# Patient Record
Sex: Male | Born: 1965
Health system: Southern US, Community
[De-identification: ages and names within clinical notes are randomized; demographics above are authoritative.]

## PROBLEM LIST (undated history)

## (undated) DIAGNOSIS — J302 Other seasonal allergic rhinitis: Secondary | ICD-10-CM

## (undated) DIAGNOSIS — T7840XA Allergy, unspecified, initial encounter: Secondary | ICD-10-CM

## (undated) DIAGNOSIS — J189 Pneumonia, unspecified organism: Secondary | ICD-10-CM

## (undated) DIAGNOSIS — R112 Nausea with vomiting, unspecified: Secondary | ICD-10-CM

## (undated) DIAGNOSIS — Z9889 Other specified postprocedural states: Secondary | ICD-10-CM

## (undated) HISTORY — DX: Pneumonia, unspecified organism: J18.9

## (undated) HISTORY — DX: Allergy, unspecified, initial encounter: T78.40XA

## (undated) HISTORY — PX: HERNIA REPAIR: SHX51

---
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
## 2001-03-01 ENCOUNTER — Emergency Department (HOSPITAL_COMMUNITY): Admission: EM | Admit: 2001-03-01 | Discharge: 2001-03-01 | Payer: Self-pay | Admitting: Internal Medicine

## 2002-03-21 ENCOUNTER — Emergency Department (HOSPITAL_COMMUNITY): Admission: EM | Admit: 2002-03-21 | Discharge: 2002-03-21 | Payer: Self-pay | Admitting: Emergency Medicine

## 2002-03-21 ENCOUNTER — Encounter: Payer: Self-pay | Admitting: Emergency Medicine

## 2007-12-09 HISTORY — PX: OTHER SURGICAL HISTORY: SHX169

## 2008-11-28 ENCOUNTER — Ambulatory Visit (HOSPITAL_COMMUNITY): Admission: RE | Admit: 2008-11-28 | Discharge: 2008-11-28 | Payer: Self-pay | Admitting: Orthopedic Surgery

## 2008-12-08 DIAGNOSIS — J189 Pneumonia, unspecified organism: Secondary | ICD-10-CM

## 2008-12-08 HISTORY — DX: Pneumonia, unspecified organism: J18.9

## 2011-04-22 NOTE — Op Note (Signed)
[name], [name]                ACCOUNT NO.:  [id number]   MEDICAL RECORD NO.:  [id number]          PATIENT TYPE:  AMB   LOCATION:  SDS                          FACILITY:  [facility]   PHYSICIAN:  Burnard Bunting, M.D.    DATE OF BIRTH:  Feb 21, 1966   DATE OF PROCEDURE:  11/28/2008  DATE OF DISCHARGE:  11/28/2008                               OPERATIVE REPORT   PREOPERATIVE DIAGNOSIS:  Left ulnar nerve instability.   POSTOPERATIVE DIAGNOSIS:  Left ulnar nerve instability.   PROCEDURE:  Left ulnar nerve anterior transposition.   SURGEON:  Burnard Bunting, MD   ASSISTANT:  Wende Neighbors, PA   ANESTHESIA:  General endotracheal.   ESTIMATED BLOOD LOSS:  25 mL.   TOURNIQUET TIME:  Approximately an hour at 300 mmHg.   INDICATIONS:  Cory Shaw is a 45 year old patient with ulnar nerve  instability who presents now for operative management after explanation  of risks and benefits.   PROCEDURE IN DETAIL:  The patient was brought to the operating room  where general endotracheal anesthesia was induced.  A time-out was  called.  Left arm was prepped with DuraPrep and draped in the sterile  manner.  [name] was used to cover the operative field.  A sterile  tourniquet was utilized.  He arm was elevated and exsanguinated with  Esmarch wrap.  Tourniquet was inflated.  Incision over the medial  condyle was made and extended 4 cm proximal and distal.  Skin and  subcutaneous tissue were sharply divided.  Crossing branches of the  medial antebrachial cutaneous nerve were preserved.  Ulnar nerve was  identified and was unstable with flexion.  The cubital tunnel was then  carefully exposed with great care taken to preserve all of blood vessels  to the artery itself.  Intermuscular septum was then excised, but a  smooth attachment was maintained at the proximal aspect of the medial  epicondyle.  This was used as a sling for the ulnar nerve transposition.  The forearm fascia was then split distally  and dissection was carried up  to the first motor branch of the ulnar nerve.  The ulnar nerve was  freely mobile over the within the sling.  At this time, the sling was  then tensioned to the overlying fascia of the subdermal layer of the  skin.  There was free mobility of the nerve within the sling, which had  a length of about a centimeter.  Arm was taken through a range of motion  and there was no return of the ulnar nerve into the groove.  At this  time, tourniquet was released.  Bleeding points were encountered which  were controlled using electrocautery.  Sling was secured using 2-0  Vicryl suture.  Skin was then closed after a thorough irrigation using  interrupted inverted 2-0 Vicryl suture and running 3-0 pullout Prolene.  Well-padded bulky posterior splint was applied along with a sling.  The  patient  was transferred to the recovery room in stable condition.  Velna Hatchet  Vernon's assistance was required during the case for mobilization and  limb positioning as well as retraction of important neurovascular  structures.  Her assistance was a medical necessity.      Burnard Bunting, M.D.  Electronically Signed     GSD/MEDQ  D:  11/28/2008  T:  11/29/2008  Job:  [id number]

## 2011-07-15 ENCOUNTER — Other Ambulatory Visit (HOSPITAL_COMMUNITY): Payer: Self-pay | Admitting: Orthopedic Surgery

## 2011-07-15 DIAGNOSIS — R531 Weakness: Secondary | ICD-10-CM

## 2011-07-15 DIAGNOSIS — M25529 Pain in unspecified elbow: Secondary | ICD-10-CM

## 2011-07-17 ENCOUNTER — Ambulatory Visit (HOSPITAL_COMMUNITY)
Admission: RE | Admit: 2011-07-17 | Discharge: 2011-07-17 | Disposition: A | Discharge status: Home / Self Care | Payer: 59 | Source: Ambulatory Visit | Attending: Orthopedic Surgery | Admitting: Orthopedic Surgery

## 2011-07-17 DIAGNOSIS — M25529 Pain in unspecified elbow: Secondary | ICD-10-CM | POA: Insufficient documentation

## 2011-07-17 DIAGNOSIS — G5620 Lesion of ulnar nerve, unspecified upper limb: Secondary | ICD-10-CM | POA: Insufficient documentation

## 2011-07-17 DIAGNOSIS — R531 Weakness: Secondary | ICD-10-CM

## 2011-09-12 LAB — CBC
HCT: 42.7 % (ref 39.0–52.0)
Hemoglobin: 14.4 g/dL (ref 13.0–17.0)
MCHC: 33.7 g/dL (ref 30.0–36.0)
MCV: 90.2 fL (ref 78.0–100.0)
Platelets: 150 10*3/uL (ref 150–400)
RBC: 4.73 MIL/uL (ref 4.22–5.81)
RDW: 12.9 % (ref 11.5–15.5)
WBC: 5.5 10*3/uL (ref 4.0–10.5)

## 2012-03-17 ENCOUNTER — Emergency Department (INDEPENDENT_AMBULATORY_CARE_PROVIDER_SITE_OTHER): Payer: 59

## 2012-03-17 ENCOUNTER — Encounter (HOSPITAL_COMMUNITY): Payer: Self-pay | Admitting: *Deleted

## 2012-03-17 ENCOUNTER — Emergency Department (HOSPITAL_COMMUNITY)
Admission: EM | Admit: 2012-03-17 | Discharge: 2012-03-17 | Disposition: A | Discharge status: Home / Self Care | Payer: 59 | Source: Home / Self Care | Attending: Emergency Medicine | Admitting: Emergency Medicine

## 2012-03-17 DIAGNOSIS — J189 Pneumonia, unspecified organism: Secondary | ICD-10-CM

## 2012-03-17 HISTORY — DX: Other seasonal allergic rhinitis: J30.2

## 2012-03-17 MED ORDER — CLARITHROMYCIN 500 MG PO TABS: 500.0000 mg | ORAL_TABLET | Freq: Two times a day (BID) | ORAL | Status: AC

## 2012-03-17 MED ORDER — LIDOCAINE HCL (PF) 1 % IJ SOLN
INTRAMUSCULAR | Status: AC
  Filled 2012-03-17: qty 5

## 2012-03-17 MED ORDER — CEFTRIAXONE SODIUM 1 G IJ SOLR
INTRAMUSCULAR | Status: AC
  Filled 2012-03-17: qty 10

## 2012-03-17 MED ORDER — CEFTRIAXONE SODIUM 1 G IJ SOLR
1.0000 g | Freq: Once | INTRAMUSCULAR | Status: AC
  Administered 2012-03-17: 1 g via INTRAMUSCULAR

## 2012-03-17 NOTE — Discharge Instructions (Signed)

## 2012-03-17 NOTE — ED Notes (Signed)
Pt states that 3 weeks ago he had an URI that he took care of himself with otc meds and rest.  States he felt better in a week, but noticed that he would have sob with exertion.  States he normally runs 3-6 miles a few times a week, but has not been able to do that and going up a flight of stairs causes him to feel tight in his chest and sob.  Denies chest pain, nausea.  Color good.

## 2012-03-17 NOTE — ED Provider Notes (Signed)
Chief Complaint  Patient presents with  . Shortness of Breath    History of Present Illness:   The patient is a 46 year old male who had an upper respiratory infection about 2 weeks ago with nasal congestion, rhinorrhea, sore throat, and cough. His symptoms all got better, but since then he's been short of breath with minimal exertion including one flight dyspnea, he usually runs daily and cannot do this. He does get some cough with exertion or exercise. He has some pain when he takes a deep breath or with exercise in his chest. He feels tired and run down. No fever, chills, nausea, vomiting, wheezing, or history of asthma. He has had no prior history of pneumonia.  Review of Systems:  Other than noted above, the patient denies any of the following symptoms. Systemic:  No fever, chills, sweats, or fatigue. ENT:  No nasal congestion, rhinorrhea, or sore throat. Pulmonary:  No cough, wheezing, shortness of breath, sputum production, hemoptysis. Cardiac:  No palpitations, rapid heartbeat, dizziness, presyncope or syncope. GI:  No abdominal pain, heartburn, nausea, or vomiting. Skin:  No rash or itching. Ext:  No leg pain or swelling.   PMFSH:  Past medical history, family history, social history, meds, and allergies were reviewed and updated as needed.  Physical Exam:   Vital signs:  BP 117/66  Pulse 75  Temp(Src) 99.4 F (37.4 C) (Oral)  Resp 18  SpO2 99% Gen:  Alert, oriented, in no distress, skin warm and dry. Eye:  PERRL, lids and conjunctivas normal.  Sclera non-icteric. ENT:  Mucous membranes moist, pharynx clear. Neck:  Supple, no adenopathy or tenderness.  No JVD. Lungs:  Clear to auscultation and percussion, no wheezes, rales or rhonchi. No egophony. No respiratory distress. Heart:  Regular rhythm.  No gallops, murmers, clicks or rubs. Chest:  No chest wall tenderness. Abdomen:  Soft, nontender, no organomegaly or mass.  Bowel sounds normal.  No pulsatile abdominal mass or  bruit. Ext:  No edema.  No calf tenderness and Homann's sign negative.  Pulses full and equal. Skin:  Warm and dry.  No rash.   Radiology:  Dg Chest 2 View  03/17/2012  *RADIOLOGY REPORT*  Clinical Data: Short of breath  CHEST - 2 VIEW  Comparison: 11/24/2008  Findings: Heart size is normal.  Mediastinal shadows are normal. There is patchy infiltrate in both lower lobes consistent with mild pneumonia.  No dense consolidation or major collapse.  No effusions.  No significant bony finding.  IMPRESSION: Patchy infiltrate in both lower lobes consistent with pneumonia.  Original Report Authenticated By: Thomasenia Sales, M.D.    EKG:   Date: 03/17/2012  Rate:   Rhythm: normal sinus rhythm  QRS Axis: normal  Intervals: normal  ST/T Wave abnormalities: normal  Conduction Disutrbances:none  Narrative Interpretation: Normal EKG.  Old EKG Reviewed: none available  An i-STAT 8 was also obtained which was completely normal, however results did not crossover into the note.   Medications given in U[facility]:  He was given Rocephin 1 g IM and tolerated this well without any immediate side effects.  Assessment:  The encounter diagnosis was Community acquired pneumonia.   Plan:   1.  The following meds were prescribed:   New Prescriptions   CLARITHROMYCIN (BIAXIN) 500 MG TABLET    Take 1 tablet (500 mg total) by mouth 2 (two) times daily.   2.  The patient was instructed in symptomatic care and handouts were given. 3.  The patient was told to  return if becoming worse in any way, if no better in 3 or 4 days, and given some red flag symptoms that would indicate earlier return.  Follow up:  The patient was told to follow up with his primary care physician, Dr. Sigmund Hazel next week, he was also told to get a repeat chest x-ray a month.     Reuben Likes, MD 03/17/12 [phone]

## 2012-03-18 LAB — POCT I-STAT, CHEM 8
BUN: 22 mg/dL (ref 6–23)
Calcium, Ion: 1.24 mmol/L (ref 1.12–1.32)
Chloride: 103 mEq/L (ref 96–112)
Creatinine, Ser: 1.1 mg/dL (ref 0.50–1.35)
Glucose, Bld: 105 mg/dL — ABNORMAL HIGH (ref 70–99)
HCT: 43 % (ref 39.0–52.0)
Hemoglobin: 14.6 g/dL (ref 13.0–17.0)
Potassium: 4.3 mEq/L (ref 3.5–5.1)
Sodium: 141 mEq/L (ref 135–145)
TCO2: 28 mmol/L (ref 0–100)

## 2012-06-22 ENCOUNTER — Telehealth: Payer: Self-pay | Admitting: Internal Medicine

## 2012-06-22 ENCOUNTER — Ambulatory Visit (INDEPENDENT_AMBULATORY_CARE_PROVIDER_SITE_OTHER): Payer: 59 | Admitting: Internal Medicine

## 2012-06-22 ENCOUNTER — Other Ambulatory Visit (INDEPENDENT_AMBULATORY_CARE_PROVIDER_SITE_OTHER): Payer: 59

## 2012-06-22 ENCOUNTER — Encounter: Payer: Self-pay | Admitting: Internal Medicine

## 2012-06-22 ENCOUNTER — Ambulatory Visit (INDEPENDENT_AMBULATORY_CARE_PROVIDER_SITE_OTHER)
Admission: RE | Admit: 2012-06-22 | Discharge: 2012-06-22 | Disposition: A | Discharge status: Home / Self Care | Payer: 59 | Source: Ambulatory Visit | Attending: Internal Medicine | Admitting: Internal Medicine

## 2012-06-22 VITALS — BP 112/78 | HR 70 | Temp 98.2°F | Ht 69.0 in | Wt 218.4 lb

## 2012-06-22 DIAGNOSIS — R918 Other nonspecific abnormal finding of lung field: Secondary | ICD-10-CM

## 2012-06-22 LAB — CBC WITH DIFFERENTIAL/PLATELET
Basophils Absolute: 0 10*3/uL (ref 0.0–0.1)
Basophils Relative: 0 % (ref 0.0–3.0)
Eosinophils Absolute: 0.2 10*3/uL (ref 0.0–0.7)
Eosinophils Relative: 3.5 % (ref 0.0–5.0)
HCT: 43.9 % (ref 39.0–52.0)
Hemoglobin: 14.8 g/dL (ref 13.0–17.0)
Lymphocytes Relative: 33.4 % (ref 12.0–46.0)
Lymphs Abs: 2.3 10*3/uL (ref 0.7–4.0)
MCHC: 33.8 g/dL (ref 30.0–36.0)
MCV: 88.2 fl (ref 78.0–100.0)
Monocytes Absolute: 0.5 10*3/uL (ref 0.1–1.0)
Monocytes Relative: 7.9 % (ref 3.0–12.0)
Neutro Abs: 3.7 10*3/uL (ref 1.4–7.7)
Neutrophils Relative %: 55.2 % (ref 43.0–77.0)
Platelets: 166 10*3/uL (ref 150.0–400.0)
RBC: 4.97 Mil/uL (ref 4.22–5.81)
RDW: 13 % (ref 11.5–14.6)
WBC: 6.8 10*3/uL (ref 4.5–10.5)

## 2012-06-22 LAB — SEDIMENTATION RATE: Sed Rate: 24 mm/hr — ABNORMAL HIGH (ref 0–22)

## 2012-06-22 MED ORDER — FAMOTIDINE 20 MG PO TABS: ORAL_TABLET | ORAL | Status: DC

## 2012-06-22 MED ORDER — PANTOPRAZOLE SODIUM 40 MG PO TBEC: 40.0000 mg | DELAYED_RELEASE_TABLET | Freq: Every day | ORAL | Status: DC

## 2012-06-22 NOTE — Progress Notes (Signed)
  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   06/22/2012 1st pulmonary eval cc abupt onset of sob and dry cough 03/22/12 dx as pna rx as outpt including abx and prednisone and inhalers > overall 50% better but can't jog and can't walk out in humidity without provoking coughing. Albuterol helps transiently.  No purulent sputum, arthralgias. No unusual exp hx. No better p prednisone.   Sleeping ok without nocturnal  or early am exacerbation  of respiratory  c/o's or need for noct saba. Also denies any obvious fluctuation of symptoms with weather or environmental changes or other aggravating or alleviating factors except as outlined above     Review of Systems  Constitutional: Negative for fever, chills, activity change, appetite change and unexpected weight change.  HENT: Positive for congestion. Negative for sore throat, rhinorrhea, sneezing, trouble swallowing, dental problem, voice change and postnasal drip.   Eyes: Negative for visual disturbance.  Respiratory: Positive for cough and shortness of breath. Negative for choking.   Cardiovascular: Negative for chest pain and leg swelling.  Gastrointestinal: Negative for nausea, vomiting and abdominal pain.  Genitourinary: Negative for difficulty urinating.  Musculoskeletal: Negative for arthralgias.  Skin: Negative for rash.  Psychiatric/Behavioral: Negative for behavioral problems and confusion.       Objective:   Physical Exam  amb wm nad Wt 218 06/22/2012  HEENT: nl dentition, turbinates, and orophanx. Nl external ear canals without cough reflex   NECK :  without JVD/Nodes/TM/ nl carotid upstrokes bilaterally   LUNGS: no  acc muscle use, trace insp crackles best heard R Base bilaterally with  cough on insp    CV:  RRR  no s3 or murmur or increase in P2, no edema   ABD:  soft and nontender with nl excursion in the supine position. No bruits or organomegaly, bowel sounds nl  MS:  warm without deformities, calf tenderness, cyanosis or clubbing  SKIN: warm and dry without lesions    NEURO:  alert, approp, no deficits    CXR  06/22/2012 : Patchy right middle and bilateral lower lobe opacities, suspicious for pneumonia.  ESR 24 06/22/12 with no eos         Assessment & Plan:

## 2012-06-22 NOTE — Telephone Encounter (Signed)
Famotidine rx has been re sent.  Cory Shaw with [facility] aware.

## 2012-06-22 NOTE — Patient Instructions (Addendum)
Please remember to go to the lab and x-ray department downstairs for your tests - we will call you with the results when they are available.    Try protonix  Take 30-60 min before first meal of the day and Pepcid 20 mg one bedtime until return   GERD (REFLUX)  is an extremely common cause of respiratory symptoms, many times with no significant heartburn at all.    It can be treated with medication, but also with lifestyle changes including avoidance of late meals, excessive alcohol, smoking cessation, and avoid fatty foods, chocolate, peppermint, colas, red wine, and acidic juices such as orange juice.  NO MINT OR MENTHOL PRODUCTS SO NO COUGH DROPS  USE SUGARLESS CANDY INSTEAD (jolley ranchers or Stover's)  NO OIL BASED VITAMINS - use powdered substitutes.  Please schedule a follow up office visit in 4 weeks, sooner if needed

## 2012-06-23 ENCOUNTER — Other Ambulatory Visit: Payer: Self-pay | Admitting: Internal Medicine

## 2012-06-23 DIAGNOSIS — R918 Other nonspecific abnormal finding of lung field: Secondary | ICD-10-CM

## 2012-06-23 LAB — ALLERGY PROFILE REGION II-DC, DE, MD, ~~LOC~~, VA
Allergen, D pternoyssinus,d7: 0.19 kU/L (ref <0.35)
Alternaria Alternata: <0.1 kU/L (ref <0.35)
Aspergillus fumigatus, IgG: <0.1 kU/L (ref <0.35)
Bermuda Grass: <0.1 kU/L (ref <0.35)
Box Elder IgE: <0.1 kU/L (ref <0.35)
Cat Dander: <0.1 kU/L (ref <0.35)
Cladosporium Herbarum: <0.1 kU/L (ref <0.35)
Cockroach: <0.1 kU/L (ref <0.35)
Common Ragweed: <0.1 kU/L (ref <0.35)
D. farinae: 0.27 kU/L (ref <0.35)
Dog Dander: <0.1 kU/L (ref <0.35)
Elm IgE: <0.1 kU/L (ref <0.35)
IgE (Immunoglobulin E), Serum: 15.9 IU/mL (ref 0.0–180.0)
Johnson Grass: <0.1 kU/L (ref <0.35)
Lamb's Quarters: <0.1 kU/L (ref <0.35)
Meadow Grass: <0.1 kU/L (ref <0.35)
Oak: <0.1 kU/L (ref <0.35)
Pecan/Hickory Tree IgE: <0.1 kU/L (ref <0.35)

## 2012-06-23 NOTE — Progress Notes (Signed)
Quick Note:  Spoke with pt and notified of results per Dr. Wert. Pt verbalized understanding and denied any questions.  ______ 

## 2012-06-23 NOTE — Assessment & Plan Note (Signed)
DDx   includes idiopathic pulmonary fibrosis, pulmonary fibrosis associated with rheumatologic diseases (which have a relatively benign course in most cases) , adverse effect from  drugs such as chemotherapy or amiodarone exposure, nonspecific interstitial pneumonia which is typically steroid responsive, and some form of hypersensitivity pneumonitis.  The other possibilities are all post acute inflammatory in nature (Boop-like) but the absence of high esr and prednisone response argues against this or any further trial so steroids or abx at this point.  Next step is CT Chest to determine how much is fibrotic vs GGChange (which indicates active alveolitis)

## 2012-06-24 NOTE — Progress Notes (Signed)
Quick Note:  Spoke with pt and notified of results per Dr. Wert. Pt verbalized understanding and denied any questions.  ______ 

## 2012-06-25 ENCOUNTER — Ambulatory Visit (INDEPENDENT_AMBULATORY_CARE_PROVIDER_SITE_OTHER)
Admission: RE | Admit: 2012-06-25 | Discharge: 2012-06-25 | Disposition: A | Discharge status: Home / Self Care | Payer: 59 | Source: Ambulatory Visit | Attending: Internal Medicine | Admitting: Internal Medicine

## 2012-06-25 DIAGNOSIS — R918 Other nonspecific abnormal finding of lung field: Secondary | ICD-10-CM

## 2012-06-25 MED ORDER — IOHEXOL 300 MG/ML  SOLN
80.0000 mL | Freq: Once | INTRAMUSCULAR | Status: AC | PRN
  Administered 2012-06-25: 80 mL via INTRAVENOUS

## 2012-06-28 ENCOUNTER — Encounter: Payer: Self-pay | Admitting: Internal Medicine

## 2012-07-20 ENCOUNTER — Ambulatory Visit: Payer: 59 | Admitting: Internal Medicine

## 2012-07-22 ENCOUNTER — Encounter: Payer: Self-pay | Admitting: Internal Medicine

## 2012-07-22 ENCOUNTER — Telehealth: Payer: Self-pay | Admitting: Internal Medicine

## 2012-07-22 ENCOUNTER — Ambulatory Visit (INDEPENDENT_AMBULATORY_CARE_PROVIDER_SITE_OTHER): Payer: 59 | Admitting: Internal Medicine

## 2012-07-22 VITALS — BP 126/84 | HR 69 | Temp 98.5°F | Ht 69.0 in | Wt 219.4 lb

## 2012-07-22 DIAGNOSIS — J310 Chronic rhinitis: Secondary | ICD-10-CM

## 2012-07-22 DIAGNOSIS — J9610 Chronic respiratory failure, unspecified whether with hypoxia or hypercapnia: Secondary | ICD-10-CM

## 2012-07-22 DIAGNOSIS — R918 Other nonspecific abnormal finding of lung field: Secondary | ICD-10-CM

## 2012-07-22 MED ORDER — FAMOTIDINE 20 MG PO TABS: ORAL_TABLET | ORAL | Status: AC

## 2012-07-22 MED ORDER — PREDNISONE 5 MG PO TABS: ORAL_TABLET | ORAL | Status: DC

## 2012-07-22 MED ORDER — PREDNISONE 20 MG PO TABS: ORAL_TABLET | ORAL | Status: AC

## 2012-07-22 MED ORDER — MONTELUKAST SODIUM 10 MG PO TABS: 10.0000 mg | ORAL_TABLET | Freq: Every day | ORAL | Status: DC

## 2012-07-22 NOTE — Telephone Encounter (Signed)
Looks like MW sent rx for pred 20 mg. 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  Prednisone 40 mg one daily with breakfast x 2 week trial then return with cxr  Singulair 10 mg one each evening   I spoke with [facility] outpatient pharmacy and is aware pt is to have the pred 20 mg. Nothing further was needed

## 2012-07-22 NOTE — Progress Notes (Signed)
  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   06/22/2012 1st pulmonary eval cc abupt onset of sob and dry cough 03/22/12 dx as pna rx as outpt including abx and prednisone and inhalers > overall 50% better but can't jog and can't walk out in humidity without provoking coughing. Albuterol helps transiently.  No purulent sputum, arthralgias. No unusual exp hx. No better p prednisone. Last took it in May 2013  rec Try protonix  Take 30-60 min before first meal of the day and Pepcid 20 mg one bedtime until return  GERD diet  07/22/2012 f/u ov/Delfin Squillace cc sob no change, cough only with exertion, zero benefit in terms of rx for gerd on cough and sob. Can walk at leisurely pace like at a grocery store but not at more than moderate pace. No obvious daytime variabilty or   cp or chest tightness, subjective wheeze overt sinus or hb symptoms. No unusual exp hx    Sleeping ok without nocturnal  or early am exacerbation  of respiratory  c/o's or need for noct saba. Also denies any obvious fluctuation of symptoms with weather or environmental changes or other aggravating or alleviating factors except as outlined above - does think his son's singulair x 5 days may have helped some of his symptoms, and note he does use zyrtec chronically for rhinitis.  ROS  The following are not active complaints unless bolded sore throat, dysphagia, dental problems, itching, sneezing,  nasal congestion or excess/ purulent secretions, ear ache,   fever, chills, sweats, unintended wt loss, pleuritic or exertional cp, hemoptysis,  orthopnea pnd or leg swelling, presyncope, palpitations, heartburn,  abdominal pain, anorexia, nausea, vomiting, diarrhea  or change in bowel or urinary habits, change in stools or urine, dysuria,hematuria,  rash, arthralgias, visual complaints, headache, numbness weakness or ataxia or problems with walking or coordination,  change in mood/affect or memory.              Objective:   Physical Exam  amb wm nad Wt 218 06/22/2012 > 07/22/2012  HEENT: nl dentition, turbinates, and orophanx. Nl external ear canals without cough reflex   NECK :  without JVD/Nodes/TM/ nl carotid upstrokes bilaterally   LUNGS: no acc muscle use, trace insp crackles best heard R Base bilaterally with  cough on insp    CV:  RRR  no s3 or murmur or increase in P2, no edema   ABD:  soft and nontender with nl excursion in the supine position. No bruits or organomegaly, bowel sounds nl  MS:  warm without deformities, calf tenderness, cyanosis or clubbing  SKIN: warm and dry without lesions    NEURO:  alert, approp, no deficits    CXR  06/22/2012 : Patchy right middle and bilateral lower lobe opacities, suspicious for pneumonia.  ESR 24 06/22/12 with no eos         Assessment & Plan:

## 2012-07-22 NOTE — Patient Instructions (Addendum)
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  Prednisone 40 mg one daily with breakfast x 2 week trial then return with cxr   Singulair 10 mg one each evening

## 2012-07-23 DIAGNOSIS — J9610 Chronic respiratory failure, unspecified whether with hypoxia or hypercapnia: Secondary | ICD-10-CM | POA: Insufficient documentation

## 2012-07-23 DIAGNOSIS — J310 Chronic rhinitis: Secondary | ICD-10-CM | POA: Insufficient documentation

## 2012-07-23 NOTE — Assessment & Plan Note (Signed)
-   onset 03/2012 with ESR 24 06/22/12 and no eos  - CT Chest 06/25/12 > 1. The appearance of the lungs is compatible with an interstitial lung disease, and the overall pattern is most suggestive of cryptogenic organizing pneumonia (COP). 2. Evidence of mild air trapping in the lung bases bilaterally, suggestive of small airways disease  I had an extended discussion with the patient today lasting 15 to 20 minutes of a 25 minute visit on the following issues:   Most likely this is BOOP or burned out BOOP (based on relatively low esr)  Discussed in detail all the  indications, usual  risks and alternatives  relative to the benefits with patient who agrees to proceed with 2 weeks of prednisone then vats bx if dx in doubt.

## 2012-07-23 NOTE — Assessment & Plan Note (Signed)
Response to his son's singulair may have been related to allergies/ rhinitis but unlikely related to his pulmonary infiltrates - ok to start it for a month trial since won't interfere with evaluation of walking sats or response to steroids

## 2012-07-23 NOTE — Assessment & Plan Note (Signed)
-   07/22/2012  Walked RA x 3 laps @ 185 ft each stopped due to  desat to 87%  Meets criteria for amb 02 but hopefully will see response to prednisone so we can avoid it.

## 2012-07-27 ENCOUNTER — Telehealth: Payer: Self-pay | Admitting: Internal Medicine

## 2012-07-27 NOTE — Telephone Encounter (Signed)
Spoke with pt and notified of recs per MW. He verbalized understanding and denied any further questions.

## 2012-07-27 NOTE — Telephone Encounter (Signed)
No, I wouldn't use a higher dose s an open lung bx to justify it as the risks are > benefits.  It's too soon to tell whether this will be an adequate dose and we'll regroup at [facility]

## 2012-07-27 NOTE — Telephone Encounter (Signed)
I spoke with pt and he stated he has not noticed a change in his breathing since his prednisone was increased to 40 mg. He is wanting to know if this can increased to see if it helps. He did mention that before he was on a high dose of prednisone and was having some side effects but so far he has not since being on prednisone 40 mg.  He did not want to wait until his next OV on 08/05/12 for this to be done. Pt aware MW is currently out of the office and was fine with a call back tomorrow. Please advise Dr. Sherene Sires thanks

## 2012-08-04 ENCOUNTER — Other Ambulatory Visit: Payer: Self-pay | Admitting: Internal Medicine

## 2012-08-04 DIAGNOSIS — J8489 Other specified interstitial pulmonary diseases: Secondary | ICD-10-CM

## 2012-08-05 ENCOUNTER — Ambulatory Visit (INDEPENDENT_AMBULATORY_CARE_PROVIDER_SITE_OTHER): Payer: 59 | Admitting: Internal Medicine

## 2012-08-05 ENCOUNTER — Encounter: Payer: Self-pay | Admitting: Internal Medicine

## 2012-08-05 ENCOUNTER — Ambulatory Visit (INDEPENDENT_AMBULATORY_CARE_PROVIDER_SITE_OTHER)
Admission: RE | Admit: 2012-08-05 | Discharge: 2012-08-05 | Disposition: A | Discharge status: Home / Self Care | Payer: 59 | Source: Ambulatory Visit | Attending: Internal Medicine | Admitting: Internal Medicine

## 2012-08-05 VITALS — BP 112/80 | HR 71 | Temp 98.4°F | Ht 69.0 in | Wt 212.0 lb

## 2012-08-05 DIAGNOSIS — R918 Other nonspecific abnormal finding of lung field: Secondary | ICD-10-CM

## 2012-08-05 DIAGNOSIS — J8489 Other specified interstitial pulmonary diseases: Secondary | ICD-10-CM

## 2012-08-05 DIAGNOSIS — J8409 Other alveolar and parieto-alveolar conditions: Secondary | ICD-10-CM

## 2012-08-05 NOTE — Assessment & Plan Note (Signed)
-   onset 03/2012 with ESR 24 06/22/12 and no eos  - CT Chest 06/25/12 > 1. The appearance of the lungs is compatible with an interstitial lung disease, and the overall pattern is most suggestive of cryptogenic organizing pneumonia (COP). 2. Evidence of mild air trapping in the lung bases bilaterally, suggestive of small airways disease - Start steroids 07/23/2012 >>>  Improved on 40 mg per day x 2 weeks c/w BOOP as suggested by CT, prob post CAP. The goal with a chronic steroid dependent illness is always arriving at the lowest effective dose that controls the disease/symptoms and not accepting a set "formula" which is based on statistics or guidelines that don't always take into account patient  variability or the natural hx of the dz in every individual patient, which may well vary over time.  For now therefore I recommend the patient maintain  A ceiling of 40 and a floor of 20 mg per day for now

## 2012-08-05 NOTE — Patient Instructions (Addendum)
Stop fish oil  GERD (REFLUX)  is an extremely common cause of respiratory symptoms(just like yours!), many times with no significant heartburn at all.    It can be treated with medication, but also with lifestyle changes including avoidance of late meals, excessive alcohol, smoking cessation, and avoid fatty foods, chocolate, peppermint, colas, red wine, and acidic juices such as orange juice.  NO MINT OR MENTHOL PRODUCTS SO NO COUGH DROPS  USE SUGARLESS CANDY INSTEAD (jolley ranchers or Stover's)  NO OIL BASED VITAMINS - use powdered substitutes.  Prednisone 20 mg 2 with bfast x 2 weeks, then 20 mg one tablets and return 4 weeks later for cxr - call sooner if needed

## 2012-08-05 NOTE — Progress Notes (Signed)
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   06/22/2012 1st pulmonary eval cc abupt onset of sob and dry cough 03/22/12 dx as pna rx as outpt including abx and prednisone and inhalers > overall 50% better but can't jog and can't walk out in humidity without provoking coughing. Albuterol helps transiently.  No purulent sputum, arthralgias. No unusual exp hx. No better p prednisone. Last took it in May 2013  rec Try protonix  Take 30-60 min before first meal of the day and Pepcid 20 mg one bedtime until return  GERD diet  07/22/2012 f/u ov/Cory Shaw cc sob no change, cough only with exertion, zero benefit in terms of rx for gerd on cough and sob. Can walk at leisurely pace like at a grocery store but not at more than moderate pace. No obvious daytime variabilty or cp or chest tightness, subjective wheeze overt sinus or hb symptoms. No unusual exp hx .  Dx ? boop rec  Prednisone 40 mg one daily with breakfast x 2 week trial then return with cxr  Singulair 10 mg one each evening  08/05/2012 f/u ov/Cory Shaw did not start singulair, on prednisone 20 mg 2 each am and improved to point where tried some jogging.  No unusual cough, purulent sputum or sinus/hb symptoms on present rx.     Sleeping ok without nocturnal  or early am exacerbation  of respiratory  c/o's or need for noct saba. Also denies any obvious fluctuation of symptoms with weather or environmental changes or other aggravating or alleviating factors except as outlined above - does think his son's singulair x 5 days may have helped some of his symptoms, and note he does use zyrtec chronically for rhinitis.  ROS   The following are not active complaints unless bolded sore throat, dysphagia, dental problems, itching, sneezing,  nasal congestion or excess/ purulent secretions, ear ache,   fever, chills, sweats, unintended wt loss, pleuritic or exertional cp, hemoptysis,  orthopnea pnd or leg swelling, presyncope, palpitations, heartburn, abdominal pain, anorexia, nausea, vomiting, diarrhea  or change in bowel or urinary habits, change in stools or urine, dysuria,hematuria,  rash, arthralgias, visual complaints, headache, numbness weakness or ataxia or problems with walking or coordination,  change in mood/affect or memory.              Objective:   Physical Exam  amb wm nad Wt 218 06/22/2012  >08/05/2012  212  HEENT: nl dentition, turbinates, and orophanx. Nl external ear canals without cough reflex   NECK :  without JVD/Nodes/TM/ nl carotid upstrokes bilaterally   LUNGS: no acc muscle use, no sign crackles or cough on insp   CV:  RRR  no s3 or murmur or increase in P2, no edema   ABD:  soft and nontender with nl excursion in the supine position. No bruits or organomegaly, bowel sounds nl  MS:  warm without deformities, calf tenderness, cyanosis or clubbing       CXR  06/22/2012 : Patchy right middle and bilateral lower lobe opacities, suspicious for pneumonia.  ESR 24 06/22/12 with no eos  Assessment & Plan:

## 2012-08-11 ENCOUNTER — Telehealth: Payer: Self-pay | Admitting: Internal Medicine

## 2012-08-11 NOTE — Telephone Encounter (Signed)
Notes Recorded by Nyoka Cowden, MD on 08/05/2012 at 1:28 PM Call pt: Reviewed cxr and no acute change so no change in recommendations made at ov - the cxr will lag way behind the clinical improvement so we'll just follow the 02 sats with exertion for now  -----  [id number]

## 2012-08-11 NOTE — Telephone Encounter (Signed)
Pt is aware of results. Jennifer Castillo, CMA  

## 2012-08-12 ENCOUNTER — Telehealth: Payer: Self-pay | Admitting: Internal Medicine

## 2012-08-12 MED ORDER — MONTELUKAST SODIUM 10 MG PO TABS: 10.0000 mg | ORAL_TABLET | Freq: Every day | ORAL | Status: DC

## 2012-08-12 NOTE — Telephone Encounter (Signed)
Refill sent pt is aware. Carlon Chaloux, CMA  

## 2012-09-16 ENCOUNTER — Ambulatory Visit: Payer: 59 | Admitting: Internal Medicine

## 2012-11-13 ENCOUNTER — Emergency Department (HOSPITAL_COMMUNITY)
Admission: EM | Admit: 2012-11-13 | Discharge: 2012-11-13 | Discharge status: Against Medical Advice | Payer: 59 | Attending: Emergency Medicine | Admitting: Emergency Medicine

## 2012-11-13 ENCOUNTER — Encounter (HOSPITAL_COMMUNITY): Payer: Self-pay | Admitting: *Deleted

## 2012-11-13 ENCOUNTER — Encounter (HOSPITAL_COMMUNITY): Payer: Self-pay | Admitting: Emergency Medicine

## 2012-11-13 ENCOUNTER — Emergency Department (HOSPITAL_COMMUNITY)
Admission: EM | Admit: 2012-11-13 | Discharge: 2012-11-13 | Disposition: A | Discharge status: Nursing Home (Includes ICF) | Payer: 59 | Source: Home / Self Care | Attending: Family Medicine | Admitting: Family Medicine

## 2012-11-13 DIAGNOSIS — L03032 Cellulitis of left toe: Secondary | ICD-10-CM

## 2012-11-13 DIAGNOSIS — R55 Syncope and collapse: Secondary | ICD-10-CM

## 2012-11-13 DIAGNOSIS — R569 Unspecified convulsions: Secondary | ICD-10-CM | POA: Insufficient documentation

## 2012-11-13 DIAGNOSIS — L03039 Cellulitis of unspecified toe: Secondary | ICD-10-CM

## 2012-11-13 MED ORDER — HYDROCODONE-ACETAMINOPHEN 5-500 MG PO TABS: 1.0000 | ORAL_TABLET | Freq: Three times a day (TID) | ORAL | Status: DC | PRN

## 2012-11-13 MED ORDER — LORAZEPAM 2 MG/ML IJ SOLN
INTRAMUSCULAR | Status: AC
  Filled 2012-11-13: qty 1

## 2012-11-13 MED ORDER — IBUPROFEN 600 MG PO TABS: 600.0000 mg | ORAL_TABLET | Freq: Three times a day (TID) | ORAL | Status: DC | PRN

## 2012-11-13 MED ORDER — DOXYCYCLINE HYCLATE 100 MG PO CAPS: 100.0000 mg | ORAL_CAPSULE | Freq: Two times a day (BID) | ORAL | Status: DC

## 2012-11-13 NOTE — ED Notes (Signed)
Answered call for help pt found convulsing and coming off of table - not responsive, eyes rolled back, body tense lasted approximately 20-30 sec. Pt color grey,pale , diaphoretic, a& o x 3 . Placed on monitor, o2 /2l, dr. Alfonse Ras and tim, emt left at beside for observation - wife called to take pt home

## 2012-11-13 NOTE — ED Notes (Signed)
Pt. Sent here from [facility] with seizure like activity.  Pt was having a procedure and loss consciousness with some seizure activity. The episode lasted a minute and they finished the procedure. Pt os stable Dr. Javier Docker him re-evaluated.

## 2012-11-13 NOTE — ED Notes (Signed)
Pt wife came to the desk and stated they are leaving.

## 2012-11-13 NOTE — ED Provider Notes (Signed)
History     CSN: [id number]  Arrival date & time 11/13/12  1053   First MD Initiated Contact with Patient 11/13/12 1250      Chief Complaint  Patient presents with  . Toe Injury    (Consider location/radiation/quality/duration/timing/severity/associated sxs/prior treatment) HPI Comments: 46 year old male with  no prior history of seizure disorder came here complaining of swelling and pain in the medial side of left toe. Reports son stepped on left big toe about one week ago. No soft drainage. No fever chills.   Past Medical History  Diagnosis Date  . Seasonal allergies   . Pneumonia     Past Surgical History  Procedure Date  . Hernia repair     Family History  Problem Relation Age of Onset  . Other Brother     NETS    History  Substance Use Topics  . Smoking status: Never Smoker   . Smokeless tobacco: Never Used  . Alcohol Use: 0.6 oz/week    1 Cans of beer per week      Review of Systems  Skin:       Left big toe swelling, redness and pain as per history of present illness    Allergies  Sulfa antibiotics  Home Medications   Current Outpatient Rx  Name  Route  Sig  Dispense  Refill  . CETIRIZINE HCL 10 MG PO TABS   Oral   Take 10 mg by mouth daily.         Marland Kitchen DOXYCYCLINE HYCLATE 100 MG PO CAPS   Oral   Take 1 capsule (100 mg total) by mouth 2 (two) times daily.   20 capsule   0   . FAMOTIDINE 20 MG PO TABS      One at bedtime   30 tablet   11   . HYDROCODONE-ACETAMINOPHEN 5-500 MG PO TABS   Oral   Take 1 tablet by mouth every 8 (eight) hours as needed for pain.   10 tablet   0   . IBUPROFEN 600 MG PO TABS   Oral   Take 1 tablet (600 mg total) by mouth every 8 (eight) hours as needed for pain.   20 tablet   0   . MONTELUKAST SODIUM 10 MG PO TABS   Oral   Take 1 tablet (10 mg total) by mouth at bedtime.   30 tablet   11   . MULTIVITAMINS PO CAPS   Oral   Take 1 capsule by mouth daily.         Marland Kitchen PANTOPRAZOLE SODIUM 40 MG  PO TBEC   Oral   Take 1 tablet (40 mg total) by mouth daily. Take 30-60 min before first meal of the day   30 tablet   2   . PREDNISONE 20 MG PO TABS      Take 2 daily with breakfast until better then one dailyu   60 tablet   2     BP 178/76  Pulse 75  Temp 98 F (36.7 C) (Oral)  Resp 18  SpO2 100%  Physical Exam  Nursing note and vitals reviewed. Constitutional: He is oriented to person, place, and time. He appears well-developed and well-nourished. No distress.  Cardiovascular: Normal heart sounds.   Pulmonary/Chest: Breath sounds normal.  Neurological: He is alert and oriented to person, place, and time.  Skin:       Left big toe: Swelling erythema and tenderness medial to the nail. Consistent with a paronychia.  ED Course  INCISION AND DRAINAGE Performed by: Sharin Grave Authorized by: Sharin Grave Consent: Verbal consent obtained. Consent given by: patient Patient understanding: patient states understanding of the procedure being performed Patient consent: the patient's understanding of the procedure matches consent given Indications for incision and drainage: Medial Paronychia of left big toe. Anesthesia: local infiltration Local anesthetic: lidocaine 1% with epinephrine Anesthetic total: 1 ml Scalpel size: 11 Incision type: single straight Complexity: simple Drainage: purulent Drainage amount: scant Comments: Minimal bleeding. Antibiotic ointment and dry dressing was applied and the top.    (including critical care time)  Patient somewhat stressed about the procedure. Reported discomfort when his feet are touched During the procedure patient started hyperventilating, loss consciousness and had a generalized seizure that lasted probably less than a minute including recovery. Face was initially pale then flushed and pale again and pupils were mydriatic and sluggish. No incontinence. Patient heart rate decreased to 40s during the event. Patient  was diaphoretic and he is blood pressure was normal 170s/ 70s at the end of the event. Patient was confused for a few seconds at the end of the seizure; not post ictal (sleepy or tired). He became alert oriented and returned back to full normal self quickly. The entire event including recovery lasted about 1 minute. The patient was placed in a heart monitor and heart rate came back to normal 70s. I was able to complete the procedure and patient remain well through it. I decided to transfer him to the emergency department for further evaluation and management as he has never had a seizure episode in the past. Patient was in stable condition at the time of transfer with no recurrent seizures.   1. Paronychia of great toe, left   2. Vasovagal attack   3. Generalized seizure       MDM  46 year old male with  no prior history of seizure disorder was here for a paronychia in the right toe. Left toe paronychia: Status post I&D. Instructed to do soaks. Prescribe doxycycline, ibuprofen and Vicodin.  Seizure episodes: Impress triggered by vasovagal reaction. No Ativan or other antiseizure medications was administered as patient recovered fast. Decided to transfer to the emergency department for further evaluation and management. Patient was stable for several minutes without seizures at the time of transfer.       Sharin Grave, MD 11/13/12 1437

## 2012-11-13 NOTE — ED Notes (Signed)
Pt reports son stepping on left great toe last week - has gotten increasingly sore and purple.

## 2012-11-14 ENCOUNTER — Telehealth (HOSPITAL_COMMUNITY): Payer: Self-pay | Admitting: Family Medicine

## 2012-11-14 NOTE — ED Notes (Signed)
Patient's wife came in today requesting discharge instructions from his visit with [place] yesterday.  Wife stated they waiting in triage for several hours in the ED.  Patient drank a sprite and felt better while waiting.  Wife states he was fine all night and this morning.  Patient and wife state he will be following up with PCP regarding toe, cold symptoms and possible seizure activity.   Patient and wife state they will go directly to ED if seizure symptoms return.  Patient was given AVS that was in the computer.

## 2013-10-26 ENCOUNTER — Ambulatory Visit (INDEPENDENT_AMBULATORY_CARE_PROVIDER_SITE_OTHER): Payer: 59 | Admitting: Sports Medicine

## 2013-10-26 ENCOUNTER — Encounter: Payer: Self-pay | Admitting: Sports Medicine

## 2013-10-26 VITALS — BP 136/90 | Ht 69.0 in | Wt 213.0 lb

## 2013-10-26 DIAGNOSIS — M25519 Pain in unspecified shoulder: Secondary | ICD-10-CM

## 2013-10-26 NOTE — Progress Notes (Signed)
  Subjective:    Patient ID: Cory Shaw, male    DOB: [birth year], 47 y.o.   MRN: [id number]  HPI chief complaint: Bilateral shoulder pain, right greater than left  Very pleasant 47 year old male comes in today complaining of bilateral shoulder pain. He initially injured the right shoulder 5 years ago when moving a tree that had fallen. With his arm in an overhead position he felt some discomfort in the right shoulder which persisted for sometime afterwards. He saw his primary care physician who prescribed some exercises which helped tremendously. In fact, he states that while exercising he was pain-free. 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 He was also limited in the amount of activity he was allowed to do and as a result his pain returned. His pulmonary condition is now under good control and 2 months ago he began working out again. He describes a sharp discomfort along the anterior lateral right shoulder especially with overhead activity or with throwing a ball. Some pain at night when sleeping on the shoulder as well. No radiating pain down the arm. No associated numbness or tingling. No neck pain. No prior shoulder surgery. He is describing a similar pain in the left arm although not as severe as what he is experiencing in the right. He gets occasional popping in the shoulder but it is not painful.  Past medical history and current medications are reviewed. Medical history is as above Surgical history is significant for a previous ulnar nerve transposition of the left elbow 4-5 years ago He is allergic to sulfa drugs He denies any tobacco use, drinks a couple of beers a week, works as an Psychiatric nurse    Review of Systems     Objective:   Physical Exam Well-developed, well-nourished. No acute distress. Awake alert and oriented x3  Right shoulder: Full range of motion. No tenderness to palpation along the clavicle or over the a.c. Joint. No  tenderness over the bicipital groove. Rotator cuff strength is 5/5 with a mildly positive empty can but a negative Hawkins. Negative O'Briens. Negative clunk. Negative apprehension. Negative Spurling's. Neurovascularly intact distally.  Left shoulder: Full range of motion. No tenderness to palpation over the clavicle or the a.c. joint. No tenderness over the bicipital groove. Rotator cuff strength is 5/5. Negative empty can, negative Hawkins, negative O'Briens. Neurovascularly intact distally  MSK ultrasound of the more symptomatic right shoulder was performed. There is a positive mushroom sign at the a.c. joint but no appreciable spurring. Supraspinatus tendon has some hypoechoic changes in the distal portion consistent with probable partial tearing of the tendon here. There is also some enlargement of the subacromial bursa consistent with subacromial bursitis. No full-thickness tear is seen. Subscapularis and teres minor are within normal limits. Labrum was difficult to visualize.       Assessment & Plan:  Bilateral shoulder pain, right greater than left, secondary to rotator cuff impingement/subacromial bursitis  Patient is given a rotator cuff strengthening program. I've cautioned him about overhead lifting or any other resisted activity in which he places his arms in a fully abducted or overhead position. He will followup with me in 4-6 weeks. If symptoms persist we could consider merits of a subacromial cortisone injection prior to further diagnostic imaging. Call with questions or concerns in the interim.

## 2013-12-05 ENCOUNTER — Ambulatory Visit: Payer: 59 | Admitting: Sports Medicine

## 2017-05-13 ENCOUNTER — Encounter (INDEPENDENT_AMBULATORY_CARE_PROVIDER_SITE_OTHER): Payer: Self-pay | Admitting: Orthopedic Surgery

## 2017-05-13 ENCOUNTER — Ambulatory Visit (INDEPENDENT_AMBULATORY_CARE_PROVIDER_SITE_OTHER): Payer: Self-pay

## 2017-05-13 ENCOUNTER — Ambulatory Visit (INDEPENDENT_AMBULATORY_CARE_PROVIDER_SITE_OTHER): Payer: 59 | Admitting: Orthopedic Surgery

## 2017-05-13 DIAGNOSIS — M25571 Pain in right ankle and joints of right foot: Secondary | ICD-10-CM | POA: Diagnosis not present

## 2017-05-15 NOTE — Progress Notes (Signed)
Office Visit Note   Patient: Cory Shaw           Date of Birth: October 30, 1966           MRN: [id number] Visit Date: 05/13/2017 Requested by: Sigmund HazelMiller, Lisa, MD [address] [place], [place] [address] PCP: Sigmund HazelMiller, Lisa, MD  Subjective: Chief Complaint  Patient presents with  . Right Ankle - Injury    HPI: Ramon Dredgedward is a patient with right ankle pain.  He injured his ankle in mid March while he is running.  The pain comes and goes but he does describe a constant aching localized to the medial aspect of the ankle.  Patient states that since the injury is not really gotten any worse but it deciliter has not gotten better.  Reports initial swelling but no weakness and no waking with pain.  He is taking some over-the-counter medication.  Does computer work.  He did roll his ankle in high school but that improves.  His mechanism of injury was a significant hyperextension mechanism through the tibiotalar joint.              ROS: All systems reviewed are negative as they relate to the chief complaint within the history of present illness.  Patient denies  fevers or chills.   Assessment & Plan: Visit Diagnoses:  1. Pain in right ankle and joints of right foot     Plan: Impression is probable symptomatic posttraumatic ossicle on the medial aspect of the ankle.  Hard to tell in the sagittal plane where the ossicle is residing.  He is having persistent pain in the ankle on a daily basis.  He localizes the pain to the location where the ossicle is.  Plan at this time is to obtain an MRI scan of the ankle distal look at that ossicle more closely..  Follow-Up Instructions: Return for after MRI.   Orders:  Orders Placed This Encounter  Procedures  . XR Ankle Complete Right  . MR Ankle Right w/o contrast   No orders of the defined types were placed in this encounter.     Procedures: No procedures performed   Clinical Data: No additional findings.  Objective: Vital Signs: There were no  vitals taken for this visit.  Physical Exam:   Constitutional: Patient appears well-developed HEENT:  Head: Normocephalic Eyes:EOM are normal Neck: Normal range of motion Cardiovascular: Normal rate Pulmonary/chest: Effort normal Neurologic: Patient is alert Skin: Skin is warm Psychiatric: Patient has normal mood and affect    Ortho Exam: Orthopedic exam demonstrates full active and passive range of motion of the ankle with medial sided tenderness.  Palpable intact nontender and she to posterior tib peroneal and Achilles tendon with symmetric tibiotalar subtalar transverse tarsal range of motion.  Patient can walk on his heels and toes.  Compartments otherwise normal.  Specialty Comments:  No specialty comments available.  Imaging: No results found.   PMFS History: Patient Active Problem List   Diagnosis Date Noted  . Chronic rhinitis 07/23/2012  . Chronic respiratory failure (HCC) 07/23/2012  . Pulmonary infiltrates 06/22/2012   Past Medical History:  Diagnosis Date  . Pneumonia   . Seasonal allergies     Family History  Problem Relation Age of Onset  . Other Brother        NETS    Past Surgical History:  Procedure Laterality Date  . 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 Smoking status: Never Smoker  . Smokeless tobacco: Never Used  . Alcohol use 0.6 oz/week    1 Cans of beer per week  . Drug use: No  . Sexual activity: Not on file

## 2017-05-21 ENCOUNTER — Encounter (INDEPENDENT_AMBULATORY_CARE_PROVIDER_SITE_OTHER): Payer: Self-pay | Admitting: Orthopedic Surgery

## 2017-05-24 ENCOUNTER — Ambulatory Visit
Admission: RE | Admit: 2017-05-24 | Discharge: 2017-05-24 | Disposition: A | Discharge status: Home / Self Care | Payer: 59 | Source: Ambulatory Visit | Attending: Orthopedic Surgery | Admitting: Orthopedic Surgery

## 2017-05-24 DIAGNOSIS — M25571 Pain in right ankle and joints of right foot: Secondary | ICD-10-CM

## 2017-06-03 ENCOUNTER — Encounter (INDEPENDENT_AMBULATORY_CARE_PROVIDER_SITE_OTHER): Payer: Self-pay | Admitting: Orthopedic Surgery

## 2017-06-03 ENCOUNTER — Ambulatory Visit (INDEPENDENT_AMBULATORY_CARE_PROVIDER_SITE_OTHER): Payer: 59 | Admitting: Orthopedic Surgery

## 2017-06-03 DIAGNOSIS — M25571 Pain in right ankle and joints of right foot: Secondary | ICD-10-CM

## 2017-06-03 NOTE — Progress Notes (Signed)
Office Visit Note   Patient: Cory Shaw           Date of Birth: 1966-07-27           MRN: [id number] Visit Date: 06/03/2017 Requested by: Sigmund Hazel, MD [address] [place], [place] [address] PCP: Sigmund Hazel, MD  Subjective: Chief Complaint  Patient presents with  . Right Ankle - Follow-up    HPI: [place] is a patient with right ankle pain.  Since that seems had an MRI scan which is reviewed.  He reports sporadic medial and lateral sided pain ankle.  His MRI scan is reviewed with him.  He does have an ossicle along the anterior medial aspect of the joint.  I think this could be giving him some impingement symptoms.  He also has a split tear in the peroneus brevis from a accessory muscle peroneus quartes.  He was able to do a 10 mile hike this weekend.  His ankle started hurting after mild 8 but it was not severe.  He takes occasional medication for the problem.              ROS: All systems reviewed are negative as they relate to the chief complaint within the history of present illness.  Patient denies  fevers or chills.   Assessment & Plan: Visit Diagnoses:  1. Pain in right ankle and joints of right foot     Plan: Impression is right ankle pain with findings consistent with his medial and lateral sided symptoms.  I think this ossicle along the anteromedial joint line is symptomatic because of the surrounding edema around the medial malleolus.  Also think that the accessory muscle the lateral aspect of the ankle could be symptomatic around the peroneal tendons.  Nonetheless he is not quite symptomatic enough yet for any type of intervention.  When he gets to that point he'll call me and I'll likely have him see Dr. Lajoyce Corners for further management.  Follow-Up Instructions: Return if symptoms worsen or fail to improve.   Orders:  No orders of the defined types were placed in this encounter.  No orders of the defined types were placed in this encounter.      Procedures: No procedures performed   Clinical Data: No additional findings.  Objective: Vital Signs: There were no vitals taken for this visit.  Physical Exam:   Constitutional: Patient appears well-developed HEENT:  Head: Normocephalic Eyes:EOM are normal Neck: Normal range of motion Cardiovascular: Normal rate Pulmonary/chest: Effort normal Neurologic: Patient is alert Skin: Skin is warm Psychiatric: Patient has normal mood and affect    Ortho Exam: Pedal examination the right ankle demonstrates palpable pedal pulses good ankle dorsiflexion plantar flexion strength without restriction of motion.  Subtalar transverse tarsal motions intact and nontender.  Patient has palpable intact nontender anterior to posterior tib peroneal and Achilles tendons.  No other masses lymph adenopathy or skin changes noted in the right ankle region  Specialty Comments:  No specialty comments available.  Imaging: No results found.   PMFS History: Patient Active Problem List   Diagnosis Date Noted  . Chronic rhinitis 07/23/2012  . Chronic respiratory failure (HCC) 07/23/2012  . Pulmonary infiltrates 06/22/2012   Past Medical History:  Diagnosis Date  . Pneumonia   . Seasonal allergies     Family History  Problem Relation Age of Onset  . Other Brother        NETS    Past Surgical History:  Procedure Laterality Date  .  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 Smoking status: Never Smoker  . Smokeless tobacco: Never Used  . Alcohol use 0.6 oz/week    1 Cans of beer per week  . Drug use: No  . Sexual activity: Not on file

## 2017-08-03 LAB — HM DIABETES EYE EXAM

## 2017-08-27 ENCOUNTER — Ambulatory Visit (INDEPENDENT_AMBULATORY_CARE_PROVIDER_SITE_OTHER): Payer: 59 | Admitting: Orthopedic Surgery

## 2017-08-27 ENCOUNTER — Encounter (INDEPENDENT_AMBULATORY_CARE_PROVIDER_SITE_OTHER): Payer: Self-pay | Admitting: Orthopedic Surgery

## 2017-08-27 DIAGNOSIS — Q742 Other congenital malformations of lower limb(s), including pelvic girdle: Secondary | ICD-10-CM | POA: Insufficient documentation

## 2017-08-27 NOTE — Progress Notes (Signed)
Patient is seen with Dr. August Saucer for accessory bone medial malleolus right ankle. Patient has pain with activities of daily living. He points directly to the accessory bone is an area of pain. His MRI scan is reviewed and this shows accessory bone within the deltoid ligament. Patient would like to have this accessory bone excised. We'll plan for surgery the orthopedic surgical center will evaluate for open versus arthroscopic excision of the accessory bone.

## 2017-08-27 NOTE — Progress Notes (Signed)
Office Visit Note   Patient: Cory Shaw           Date of Birth: 01/17/1966           MRN: [id number] Visit Date: 08/27/2017 Requested by: Sigmund Hazel, MD [address] [place], [place] [address] PCP: Sigmund Hazel, MD  Subjective: Chief Complaint  Patient presents with  . Right Ankle - Pain    HPI: Cory Shaw is a 51 year old patient with right ankle pain.  Since I have seen him he has tried returning to activity.  He reports that he is not worse but is not really better.  After he runs he hurts worse on the medial and lateral aspect of the ankle but the medial side is worse.  Hs a little bit of lateral sided right ankle pain with dorsiflexion.  MRI scan does show medial ossicle and peroneus tertius which is an accessory muscle.  He would like to have intervention at this time              ROS: All systems reviewed are negative as they relate to the chief complaint within the history of present illness.  Patient denies  fevers or chills.   Assessment & Plan: Visit Diagnoses:  1. Accessory bone of foot     Plan: impression is right sided ankle pain mostly medial with ossicle which is just lateral to the deltoid ligament.  This is in an area that is likely symptomatic and and it is near his area of maximal tenderness.  I think excision is indicated.  I'll have Dr. Lajoyce Corners examine him which he did today.  He agrees and he will proceed with excision of thislikely posttraumatic bone fragment which is in the deltoid gutter of the right ankle.  The patient states he can live with the lateral sided pain for now  Follow-Up Instructions: Return in about 1 week (around 09/03/2017).   Orders:  No orders of the defined types were placed in this encounter.  No orders of the defined types were placed in this encounter.     Procedures: No procedures performed   Clinical Data: No additional findings.  Objective: Vital Signs: There were no vitals taken for this visit.  Physical Exam:    Constitutional: Patient appears well-developed HEENT:  Head: Normocephalic Eyes:EOM are normal Neck: Normal range of motion Cardiovascular: Normal rate Pulmonary/chest: Effort normal Neurologic: Patient is alert Skin: Skin is warm Psychiatric: Patient has normal mood and affect    Ortho Exam: orthopedic exam demonstrates good ankle dorsiflexion plantar flexion strength with palpable intact nontender anterior to posterior tib peroneal and Achilles tendon.  Some tenderness is present just below that medial malleolus.  No swelling in the ankle is noted.  Pedal pulses palpable.  No other masses lymph adenopathy or skin changes noted in the ankle region  Specialty Comments:  No specialty comments available.  Imaging: No results found.   PMFS History: Patient Active Problem List   Diagnosis Date Noted  . Accessory bone of foot 08/27/2017  . Chronic rhinitis 07/23/2012  . Chronic respiratory failure (HCC) 07/23/2012  . Pulmonary infiltrates 06/22/2012   Past Medical History:  Diagnosis Date  . Pneumonia   . Seasonal allergies     Family History  Problem Relation Age of Onset  . Other Brother        NETS    Past Surgical History:  Procedure Laterality Date  . 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 Smoking status: Never Smoker  . Smokeless tobacco: Never Used  . Alcohol use 0.6 oz/week    1 Cans of beer per week  . Drug use: No  . Sexual activity: Not on file

## 2017-08-27 NOTE — Addendum Note (Signed)
Addended by: Rise Paganini on: 08/27/2017 02:50 PM   Modules accepted: Level of Service

## 2017-10-08 ENCOUNTER — Other Ambulatory Visit (INDEPENDENT_AMBULATORY_CARE_PROVIDER_SITE_OTHER): Payer: Self-pay | Admitting: Orthopedic Surgery

## 2017-10-08 DIAGNOSIS — Q742 Other congenital malformations of lower limb(s), including pelvic girdle: Secondary | ICD-10-CM

## 2017-10-09 NOTE — Pre-Procedure Instructions (Signed)
Cory Shaw  10/09/2017      [facility] [place], [place] - [address] [place] [address] Phone: [phone] Fax: [phone]  [facility] [address] - [place], [place] - [id number] [facility] [facility] [place] [id number] Phone: [phone] Fax: [phone]    Your procedure is scheduled on November 7  [facility] Admitting at [phone] A.M.  Call this number if you have problems the morning of surgery:  [phone]   Remember:  Do not eat food or drink liquids after midnight.  Continue all other medications as directed by your physician except follow these medication instructions before surgery   Take these medicines the morning of surgery with A SIP OF WATER  fluticasone (FLONASE)  7 days prior to surgery STOP taking any Aspirin (unless otherwise instructed by your surgeon), Aleve, Naproxen, Ibuprofen, Motrin, Advil, Goody's, BC's, all herbal medications, fish oil, and all vitamins    Do not wear jewelry  Do not wear lotions, powders, or cologne, or deoderant.  Men may shave face and neck.  Do not bring valuables to the hospital.  [facility] is not responsible for any belongings or valuables.  Contacts, dentures or bridgework may not be worn into surgery.  Leave your suitcase in the car.  After surgery it may be brought to your room.  For patients admitted to the hospital, discharge time will be determined by your treatment team.  Patients discharged the day of surgery will not be allowed to drive home.    Special instructions:   [place]- Preparing For Surgery  Before surgery, you can play an important role. Because skin is not sterile, your skin needs to be as free of germs as possible. You can reduce the number of germs on your skin by washing with CHG (chlorahexidine gluconate) Soap before surgery.  CHG is an antiseptic cleaner  which kills germs and bonds with the skin to continue killing germs even after washing.  Please do not use if you have an allergy to CHG or antibacterial soaps. If your skin becomes reddened/irritated stop using the CHG.  Do not shave (including legs and underarms) for at least 48 hours prior to first CHG shower. It is OK to shave your face.  Please follow these instructions carefully.   1. Shower the NIGHT BEFORE SURGERY and the MORNING OF SURGERY with CHG.   2. If you chose to wash your hair, wash your hair first as usual with your normal shampoo.  3. After you shampoo, rinse your hair and body thoroughly to remove the shampoo.  4. Use CHG as you would any other liquid soap. You can apply CHG directly to the skin and wash gently with a scrungie or a clean washcloth.   5. Apply the CHG Soap to your body ONLY FROM THE NECK DOWN.  Do not use on open wounds or open sores. Avoid contact with your eyes, ears, mouth and genitals (private parts). Wash Face and genitals (private parts)  with your normal soap.  6. Wash thoroughly, paying special attention to the area where your surgery will be performed.  7. Thoroughly rinse your body with warm water from the neck down.  8. DO NOT shower/wash with your normal soap after using and rinsing off the CHG Soap.  9. Pat yourself dry with a CLEAN TOWEL.  10. Wear CLEAN PAJAMAS to bed  the night before surgery, wear comfortable clothes the morning of surgery  11. Place CLEAN SHEETS on your bed the night of your first shower and DO NOT SLEEP WITH PETS.    Day of Surgery: Do not apply any deodorants/lotions. Please wear clean clothes to the hospital/surgery center.      Please read over the following fact sheets that you were given.

## 2017-10-12 ENCOUNTER — Encounter (HOSPITAL_COMMUNITY)
Admission: RE | Admit: 2017-10-12 | Discharge: 2017-10-12 | Disposition: A | Discharge status: Home / Self Care | Payer: 59 | Source: Ambulatory Visit | Attending: Orthopedic Surgery | Admitting: Orthopedic Surgery

## 2017-10-12 ENCOUNTER — Other Ambulatory Visit: Payer: Self-pay

## 2017-10-12 ENCOUNTER — Encounter (HOSPITAL_COMMUNITY): Payer: Self-pay

## 2017-10-12 ENCOUNTER — Encounter (HOSPITAL_COMMUNITY): Payer: Self-pay | Admitting: Vascular Surgery

## 2017-10-12 DIAGNOSIS — Z01812 Encounter for preprocedural laboratory examination: Secondary | ICD-10-CM | POA: Insufficient documentation

## 2017-10-12 DIAGNOSIS — Q742 Other congenital malformations of lower limb(s), including pelvic girdle: Secondary | ICD-10-CM | POA: Diagnosis not present

## 2017-10-12 DIAGNOSIS — R739 Hyperglycemia, unspecified: Secondary | ICD-10-CM | POA: Insufficient documentation

## 2017-10-12 HISTORY — DX: Other specified postprocedural states: Z98.890

## 2017-10-12 HISTORY — DX: Nausea with vomiting, unspecified: R11.2

## 2017-10-12 LAB — CBC
HCT: 40.7 % (ref 39.0–52.0)
Hemoglobin: 14.3 g/dL (ref 13.0–17.0)
MCH: 30.7 pg (ref 26.0–34.0)
MCHC: 35.1 g/dL (ref 30.0–36.0)
MCV: 87.3 fL (ref 78.0–100.0)
Platelets: 124 10*3/uL — ABNORMAL LOW (ref 150–400)
RBC: 4.66 MIL/uL (ref 4.22–5.81)
RDW: 12.5 % (ref 11.5–15.5)
WBC: 5.1 10*3/uL (ref 4.0–10.5)

## 2017-10-12 LAB — APTT: aPTT: 34 seconds (ref 24–36)

## 2017-10-12 LAB — SURGICAL PCR SCREEN
MRSA, PCR: NEGATIVE
Staphylococcus aureus: NEGATIVE

## 2017-10-12 LAB — HEMOGLOBIN A1C
Hgb A1c MFr Bld: 7.5 % — ABNORMAL HIGH (ref 4.8–5.6)
Mean Plasma Glucose: 168.55 mg/dL

## 2017-10-12 LAB — PROTIME-INR
INR: 1.01
Prothrombin Time: 13.2 seconds (ref 11.4–15.2)

## 2017-10-12 LAB — COMPREHENSIVE METABOLIC PANEL
ALT: 112 U/L — ABNORMAL HIGH (ref 17–63)
AST: 67 U/L — ABNORMAL HIGH (ref 15–41)
Albumin: 4 g/dL (ref 3.5–5.0)
Alkaline Phosphatase: 52 U/L (ref 38–126)
Anion gap: 9 (ref 5–15)
BUN: 15 mg/dL (ref 6–20)
CO2: 25 mmol/L (ref 22–32)
Calcium: 9.3 mg/dL (ref 8.9–10.3)
Chloride: 101 mmol/L (ref 101–111)
Creatinine, Ser: 0.93 mg/dL (ref 0.61–1.24)
GFR calc Af Amer: >60 mL/min (ref >60)
GFR calc non Af Amer: >60 mL/min (ref >60)
Glucose, Bld: 253 mg/dL — ABNORMAL HIGH (ref 65–99)
Potassium: 4.6 mmol/L (ref 3.5–5.1)
Sodium: 135 mmol/L (ref 135–145)
Total Bilirubin: 1.3 mg/dL — ABNORMAL HIGH (ref 0.3–1.2)
Total Protein: 6.4 g/dL — ABNORMAL LOW (ref 6.5–8.1)

## 2017-10-12 NOTE — Progress Notes (Signed)
Message [facility] for [facility] in Dr. Lajoyce Cornersuda' office regarding Glucose 253.  A1C added to labs

## 2017-10-12 NOTE — Progress Notes (Signed)
Denies any cardiac history  Denies any Cardiac Studies

## 2017-10-13 NOTE — Progress Notes (Signed)
Anesthesia Chart Review:  Pt is a 51 year old male scheduled for excision of accessory bone R medial ankle on 10/14/2017 with Aldean BakerMarcus Duda, MD  PMH includes:  Seasonal allergies. Post-op N/V.  Never smoker. BMI 34  Medications reviewed.   BP 129/79   Pulse 84   Temp 36.8 C   Resp 20   Ht 5\' 9"  (1.753 m)   Wt 227 lb 14.4 oz (103.4 kg)   SpO2 99%   BMI 33.65 kg/m   Preoperative labs reviewed.   - HbA1c 7.5, glucose 253. Pt does not have established dx of DM.    EKG will be obtained day of surgery.   I spoke with Elnita Maxwellheryl in Dr. Audrie Liauda's office.  Pt will need to be evaluated for new onset DM prior to surgery.   Rica Mastngela Reghan Thul, FNP-BC [facility] Phone: [phone] 10/13/2017 9:38 AM

## 2017-10-14 ENCOUNTER — Ambulatory Visit (HOSPITAL_COMMUNITY): Admission: RE | Admit: 2017-10-14 | Payer: 59 | Source: Ambulatory Visit | Admitting: Orthopedic Surgery

## 2017-10-14 ENCOUNTER — Encounter (HOSPITAL_COMMUNITY): Admission: RE | Payer: Self-pay | Source: Ambulatory Visit

## 2017-10-14 SURGERY — EXCISION OF ACCESSORY OSSICLE
Anesthesia: Choice | Laterality: Right

## 2017-10-15 ENCOUNTER — Encounter: Payer: Self-pay | Admitting: Family Medicine

## 2017-10-15 ENCOUNTER — Ambulatory Visit (INDEPENDENT_AMBULATORY_CARE_PROVIDER_SITE_OTHER): Payer: 59 | Admitting: Family Medicine

## 2017-10-15 VITALS — BP 120/90 | HR 72 | Ht 69.0 in | Wt 223.0 lb

## 2017-10-15 DIAGNOSIS — E1165 Type 2 diabetes mellitus with hyperglycemia: Secondary | ICD-10-CM | POA: Diagnosis not present

## 2017-10-15 DIAGNOSIS — R748 Abnormal levels of other serum enzymes: Secondary | ICD-10-CM

## 2017-10-15 DIAGNOSIS — Z23 Encounter for immunization: Secondary | ICD-10-CM | POA: Diagnosis not present

## 2017-10-15 DIAGNOSIS — D696 Thrombocytopenia, unspecified: Secondary | ICD-10-CM | POA: Insufficient documentation

## 2017-10-15 MED ORDER — METFORMIN HCL ER 500 MG PO TB24: ORAL_TABLET | ORAL | 11 refills | Status: DC

## 2017-10-15 MED FILL — METFORMIN HCL ER 500 MG TAB: 500 | 45 days supply | Qty: 90 | Fill #0

## 2017-10-15 NOTE — Patient Instructions (Signed)
Start metformin 500 mg daily.  Take this for 1-2 weeks.  If you are not having any severe side effects you can increase to 1000 mg daily.  You can do this for 1-2 weeks.  Again if you are not having any side effects, you can increase to 1500 mg daily.  Come back in about a month to recheck your A1c.  I sent in a referral to the diabetes educator.  They will be calling you to set up your appointment.  Take care, Dr. Jimmey RalphParker

## 2017-10-15 NOTE — Assessment & Plan Note (Signed)
Noted on his last CBC.  No signs of bleeding.  May be related to his liver dysfunction (see below problem).  Recheck CBC in the next of couple months.

## 2017-10-15 NOTE — Assessment & Plan Note (Signed)
Discussed lifestyle interventions with patient.  Will start metformin 500 mg daily.  Increase to 1000 mg daily in 1-2 weeks if tolerating without side effects.  Given that he would like to rapidly improve his A1c, also instructed patient that he could increase to 1500 mg daily depending on severity of his side effects.  Referral to diabetes educator placed today.  Follow-up in 1 month for repeat A1c.

## 2017-10-15 NOTE — Progress Notes (Signed)
Subjective:  Cory Shaw H Salm is a 51 y.o. male who presents today with a chief complaint of type 2 diabetes and to establish care.   HPI:  T2DM with hyperglycemia, new issue Patient was scheduled to undergo orthopedic surgery today.  Reports that he had blood work done last week that showed hyperglycemia and an A1c consistent with diabetes.  He had never been diagnosed with diabetes in the past.  He is not currently on any medications.  He endorses a diet high in sugar over the past several weeks.  Tries to exercise regularly.  Patient was told that his A1c need to be less than 7 prior to having the surgery done.  No polyuria polydipsia.  Thrombocytopenia, new issue Noted on his lab work last week.  No active bleeding.  Elevated transaminases, new issue Also noted on his lab work last week.  No abdominal pain.  No nausea or vomiting.  ROS: Per HPI, otherwise a 14 point review of systems was performed and was negative  PMH:  The following were reviewed and entered/updated in epic: Past Medical History:  Diagnosis Date  . Allergy   . Pneumonia 2010  . PONV (postoperative nausea and vomiting)    No amnesia causing drugs  . Seasonal allergies    Patient Active Problem List   Diagnosis Date Noted  . Type 2 diabetes mellitus with hyperglycemia (HCC) 10/15/2017  . Thrombocytopenia (HCC) 10/15/2017  . Abnormal transaminases 10/15/2017  . Accessory bone of foot 08/27/2017  . Chronic rhinitis 07/23/2012  . Chronic respiratory failure (HCC) 07/23/2012  . Pulmonary infiltrates 06/22/2012   Past Surgical History:  Procedure Laterality Date  . HERNIA REPAIR    . transpositon of ulna nerve Left 2009    Family History  Problem Relation Age of Onset  . Other Brother        NETS  . Diabetes Mother   . Pneumonia Mother   . Atrial fibrillation Mother   . Heart attack Maternal Grandfather     Medications- reviewed and updated Current Outpatient Medications  Medication Sig  Dispense Refill  . cetirizine (ZYRTEC) 10 MG tablet Take 10 mg by mouth at bedtime.     . fluticasone (FLONASE) 50 MCG/ACT nasal spray Place 1 spray into both nostrils daily as needed for allergies or rhinitis.    Marland Kitchen. ibuprofen (ADVIL,MOTRIN) 200 MG tablet Take 400 mg by mouth at bedtime.    . Multiple Vitamin (MULTIVITAMIN) capsule Take 1 capsule by mouth daily.    . metFORMIN (GLUCOPHAGE XR) 500 MG 24 hr tablet Take 1 tablet (500 mg total) daily with breakfast for 7 days by mouth, THEN 2 tablets (1,000 mg total) daily with breakfast for 7 days. 90 tablet 11   No current facility-administered medications for this visit.     Allergies-reviewed and updated Allergies  Allergen Reactions  . Sulfa Antibiotics Hives   2kids  Social History   Socioeconomic History  . Marital status: Married    Spouse name: None  . Number of children: None  . Years of education: None  . Highest education level: None  Social Needs  . Financial resource strain: None  . Food insecurity - worry: None  . Food insecurity - inability: None  . Transportation needs - medical: None  . Transportation needs - non-medical: None  Occupational History  . Occupation: Stage managerT Tech for [employer]     Employer: [employer]  Tobacco Use  . Smoking status: Never Smoker  . Smokeless  tobacco: Never Used  Substance and Sexual Activity  . Alcohol use: Yes    Alcohol/week: 0.6 oz    Types: 1 Cans of beer per week  . Drug use: No  . Sexual activity: Yes  Other Topics Concern  . None  Social History Narrative  . None   Objective:  Physical Exam: BP 120/90   Pulse 72   Ht 5\' 9"  (1.753 m)   Wt 223 lb (101.2 kg)   SpO2 98%   BMI 32.93 kg/m   Gen: NAD, resting comfortably CV: RRR with no murmurs appreciated Pulm: NWOB, CTAB with no crackles, wheezes, or rhonchi GI: Normal bowel sounds present. Soft, Nontender, Nondistended. MSK: No edema, cyanosis, or clubbing noted Skin: Warm, dry Neuro: Grossly normal, moves all  extremities Psych: Normal affect and thought content  Review of old labs: CMET: Sodium 135, potassium 4.6, chloride 101, bicarb 25, glucose 253, BUN 15, creatinine 0.93, calcium 9.3, AST 67, ALT 112, total protein 1.3  CBC: WBC 5.1, hemoglobin 14.3, hematocrit 40.7, platelets 124  A1c 7.5%, mean plasma glucose 168  Assessment/Plan:  Type 2 diabetes mellitus with hyperglycemia (HCC) Discussed lifestyle interventions with patient.  Will start metformin 500 mg daily.  Increase to 1000 mg daily in 1-2 weeks if tolerating without side effects.  Given that he would like to rapidly improve his A1c, also instructed patient that he could increase to 1500 mg daily depending on severity of his side effects.  Referral to diabetes educator placed today.  Follow-up in 1 month for repeat A1c.  Thrombocytopenia (HCC) Noted on his last CBC.  No signs of bleeding.  May be related to his liver dysfunction (see below problem).  Recheck CBC in the next of couple months.  Abnormal transaminases Mildly elevated on CMET last week.  Ratio not consistent with alcoholic hepatitis.  Most likely secondary to fatty liver disease.  Anticipate some improvement with metformin.  Will need repeat CMET soon.  Consider hepatitis workup if levels do not improve.  Preventative healthcare Flu shot given today.  PPSV23 given today.  Tdap given today as well.  Patient will follow up soon for his conference of physical exam including screening blood work.  Katina Degreealeb M. Jimmey RalphParker, MD 10/15/2017 9:52 AM

## 2017-10-15 NOTE — Assessment & Plan Note (Signed)
Mildly elevated on CMET last week.  Ratio not consistent with alcoholic hepatitis.  Most likely secondary to fatty liver disease.  Anticipate some improvement with metformin.  Will need repeat CMET soon.  Consider hepatitis workup if levels do not improve.

## 2017-11-16 ENCOUNTER — Ambulatory Visit: Payer: 59 | Admitting: Family Medicine

## 2017-11-18 ENCOUNTER — Encounter: Payer: Self-pay | Admitting: Family Medicine

## 2017-11-18 ENCOUNTER — Ambulatory Visit (INDEPENDENT_AMBULATORY_CARE_PROVIDER_SITE_OTHER): Payer: 59 | Admitting: Family Medicine

## 2017-11-18 ENCOUNTER — Other Ambulatory Visit: Payer: Self-pay

## 2017-11-18 VITALS — BP 144/87 | HR 65 | Ht 69.0 in | Wt 215.2 lb

## 2017-11-18 DIAGNOSIS — E1165 Type 2 diabetes mellitus with hyperglycemia: Secondary | ICD-10-CM

## 2017-11-18 LAB — POCT GLYCOSYLATED HEMOGLOBIN (HGB A1C): Hemoglobin A1C: 5.8

## 2017-11-18 MED ORDER — METFORMIN HCL 500 MG PO TABS: 1500.0000 mg | ORAL_TABLET | Freq: Every day | ORAL | 3 refills | Status: DC

## 2017-11-18 MED ORDER — METFORMIN HCL ER 500 MG PO TB24: 1500.0000 mg | ORAL_TABLET | Freq: Every day | ORAL | 3 refills | Status: DC

## 2017-11-18 NOTE — Progress Notes (Signed)
    Subjective:  Cory Shaw is a 51 y.o. male who presents today with a chief complaint of type 2 diabetes follow-up.   HPI:  DIABETES Type II, established problem, improving Medications: metformin 1500 mg daily, Reports taking and tolerating without side effects. Additional history: Patient seen about a month ago.  Was started on metformin which she has self titrated to 1500 mg daily.  He has not had any side effects with this dose.  Thinks it is working well for him.  He is also nearly eliminated all carbohydrates from his diet.  He regularly exercises.  No polyuria or polydipsia.  No hypoglycemic symptoms.  Elevated blood pressure reading, new problem Typically well controlled per patient.  Took a Sudafed this morning due to some sinus congestion.  ROS: Per HPI  Objective:  Physical Exam: BP (!) 144/87 (BP Location: Left Arm, Cuff Size: Normal)   Pulse 65   Ht 5\' 9"  (1.753 m)   Wt 215 lb 3.2 oz (97.6 kg)   SpO2 99%   BMI 31.78 kg/m   Gen: NAD, resting comfortably CV: RRR with no murmurs appreciated Pulm: NWOB, CTAB with no crackles, wheezes, or rhonchi GI: Normal bowel sounds present. Soft, Nontender, Nondistended. MSK: Foot exam performed today.  See flow sheet for details.  Assessment/Plan:  Type 2 diabetes mellitus with hyperglycemia (HCC) A1c improved 5.8 today.  Continue Metformin 1500 mg daily.  Patient is also down about 8 pounds since last visit-encourage patient to keep up his lifestyle modifications and congratulated him on his weight loss.  Foot exam performed today.  Eye exam performed in September-we will obtain these records.  Unable to provide a urine specimen for urine microalbumin today.  Follow-up in 6 months.  Elevated blood pressure reading, New issue Likely elevated due to recent sudafed use. Typically well controlled. No indication start medication today. Advised continued home BP monitoring with goal 140/90 or lower. Follow up at next office  appointment. Return precautions reviewed.   Katina Degreealeb M. Jimmey RalphParker, MD 11/18/2017 11:24 AM

## 2017-11-18 NOTE — Assessment & Plan Note (Signed)
A1c improved 5.8 today.  Continue Metformin 1500 mg daily.  Patient is also down about 8 pounds since last visit-encourage patient to keep up his lifestyle modifications and congratulated him on his weight loss.  Foot exam performed today.  Eye exam performed in September-we will obtain these records.  Unable to provide a urine specimen for urine microalbumin today.  Follow-up in 6 months.

## 2017-11-18 NOTE — Patient Instructions (Signed)
Your A1c is much better! Keep up the good work!  Plan on coming back in about 6 months, or sooner as needed.  Take care,  Dr Jimmey RalphParker

## 2017-11-20 MED FILL — METFORMIN HCL ER 500 MG TAB: 500 | 90 days supply | Qty: 270 | Fill #0

## 2017-12-11 ENCOUNTER — Encounter: Payer: Self-pay | Admitting: Physical Therapy

## 2018-03-01 MED FILL — METFORMIN HCL ER 500 MG TAB: 500 | 90 days supply | Qty: 270 | Fill #1

## 2018-05-19 ENCOUNTER — Ambulatory Visit: Payer: 59 | Admitting: Family Medicine

## 2018-05-21 ENCOUNTER — Ambulatory Visit (INDEPENDENT_AMBULATORY_CARE_PROVIDER_SITE_OTHER): Payer: 59 | Admitting: Family Medicine

## 2018-05-21 ENCOUNTER — Encounter: Payer: Self-pay | Admitting: Family Medicine

## 2018-05-21 VITALS — BP 136/84 | HR 55 | Temp 97.6°F | Ht 69.0 in | Wt 193.0 lb

## 2018-05-21 DIAGNOSIS — J9610 Chronic respiratory failure, unspecified whether with hypoxia or hypercapnia: Secondary | ICD-10-CM

## 2018-05-21 DIAGNOSIS — M25511 Pain in right shoulder: Secondary | ICD-10-CM | POA: Insufficient documentation

## 2018-05-21 DIAGNOSIS — M25512 Pain in left shoulder: Secondary | ICD-10-CM

## 2018-05-21 DIAGNOSIS — Q742 Other congenital malformations of lower limb(s), including pelvic girdle: Secondary | ICD-10-CM | POA: Diagnosis not present

## 2018-05-21 DIAGNOSIS — E1165 Type 2 diabetes mellitus with hyperglycemia: Secondary | ICD-10-CM

## 2018-05-21 LAB — POCT GLYCOSYLATED HEMOGLOBIN (HGB A1C): Hemoglobin A1C: 4.9 % (ref 4.0–5.6)

## 2018-05-21 LAB — MICROALBUMIN / CREATININE URINE RATIO
Creatinine,U: 96.5 mg/dL
Microalb Creat Ratio: 0.7 mg/g (ref 0.0–30.0)
Microalb, Ur: <0.7 mg/dL (ref 0.0–1.9)

## 2018-05-21 MED ORDER — MONTELUKAST SODIUM 10 MG PO TABS: 10.0000 mg | ORAL_TABLET | Freq: Every day | ORAL | 3 refills | Status: DC

## 2018-05-21 MED FILL — MONTELUKAST SOD 10 MG TAB: 10 | 30 days supply | Qty: 30 | Fill #0

## 2018-05-21 NOTE — Assessment & Plan Note (Signed)
Stable problem.  Will place referral to orthopedics once he switches to new insurance.

## 2018-05-21 NOTE — Assessment & Plan Note (Addendum)
Patient is doing very well.  His A1c has improved to 4.9 today.  He is down 22 pounds since his visit 7 months ago.  Congratulated patient on his weight loss and improved A1c.  Encouraged continued lifestyle modifications.  Given his significant improvement, in addition to the side effects of metformin, we will stop this today.  He will follow-up with me in a few months.  Will need repeat A1c in 6 to 12 months.  Check urine microalbumin today.

## 2018-05-21 NOTE — Patient Instructions (Signed)
It was very nice to see you today!  Your A1c today look great.  Please keep up the good work.  I am very pleased with your numbers and your weight loss.  I will send in your Singulair.  Please call your insurance company to ask if they will cover the Cologuard.  Please come back to see me in a few months for your annual physical.  Let me know when you would like for me to send in the referral for orthopedics.  Take care, Dr Jimmey RalphParker

## 2018-05-21 NOTE — Progress Notes (Signed)
   Subjective:  Cory Shaw is a 52 y.o. male who presents today with a chief complaint of T2DM.   HPI:  T2DM, chronic problem Patient seen about 6 months ago for this. At that time was doing well on metformin 1500mg  daily. A1c at that time was 5.8.  Patient has done very well over the past several weeks.  He stopped his metformin a couple weeks ago due to diarrhea.  He has tried to cut out carbs and sugary foods from his diet.  History of pulmonary infiltrates/chronic respiratory failure/seasonal allergies Patient had a flareup a few weeks ago that he thought was due to seasonal allergies.  Symptoms have since resolved.  States that he gets these flares once or twice yearly.  The past he is taking Singulair for this which is helped with his symptoms.  No current chest pain.  No current shortness of breath.  Would like a refill on his Singulair today.  Bilateral Shoulder Pain, chronic problem, new to provider Several year history.  Worsened recently.  Has been evaluated by sports medicine in the past and told that he had bilateral rotator cuff tears.  Ankle pain, chronic problem Patient will be having surgery done to remove an accessory bone in his foot and ankle.  He will be switching insurances at the end of the month and will need a new referral for this.  ROS: Per HPI  PMH: He reports that he has never smoked. He has never used smokeless tobacco. He reports that he drinks about 0.6 oz of alcohol per week. He reports that he does not use drugs.  Objective:  Physical Exam: BP 136/84 (BP Location: Left Arm, Patient Position: Sitting, Cuff Size: Normal)   Pulse (!) 55   Temp 97.6 F (36.4 C) (Oral)   Ht 5\' 9"  (1.753 m)   Wt 193 lb (87.5 kg)   SpO2 96%   BMI 28.50 kg/m   Wt Readings from Last 3 Encounters:  05/21/18 193 lb (87.5 kg)  11/18/17 215 lb 3.2 oz (97.6 kg)  10/15/17 223 lb (101.2 kg)  Gen: NAD, resting comfortably CV: RRR with no murmurs Appreciated Pulm: NWOB, CTAB  with no crackles, wheezes, or rhonchi  A1c 4.9  Assessment/Plan:  Type 2 diabetes mellitus with hyperglycemia (HCC) Patient is doing very well.  His A1c has improved to 4.9 today.  He is down 22 pounds since his visit 7 months ago.  Congratulated patient on his weight loss and improved A1c.  Encouraged continued lifestyle modifications.  Given his significant improvement, in addition to the side effects of metformin, we will stop this today.  He will follow-up with me in a few months.  Will need repeat A1c in 6 to 12 months.  Check urine microalbumin today.  Chronic respiratory failure Stable.  Pain 96% on room air.  Pulmonary exam normal.  Will send in refill for his Singulair.  Bilateral shoulder pain Stable.  Continue home exercises.  Patient wishes to have his ankle fixed before working on his shoulders.  Consider sports med and/or PT referral in the future if patient desires.  Accessory bone of foot Stable problem.  Will place referral to orthopedics once he switches to new insurance.  Preventative healthcare Patient will return soon for his annual physical.  Today we discussed options for colon cancer screening.  He will look into Cologuard.  Katina Degreealeb M. Jimmey RalphParker, MD 05/21/2018 9:59 AM

## 2018-05-21 NOTE — Assessment & Plan Note (Signed)
Stable.  Continue home exercises.  Patient wishes to have his ankle fixed before working on his shoulders.  Consider sports med and/or PT referral in the future if patient desires.

## 2018-05-21 NOTE — Assessment & Plan Note (Signed)
Stable.  Pain 96% on room air.  Pulmonary exam normal.  Will send in refill for his Singulair.

## 2018-05-21 NOTE — Progress Notes (Signed)
Dr Lavone NeriParker's interpretation of your lab work:  Good news! Your urine test is normal without any signs of protein or kidney issues.   If you have any additional questions, please give us a call or send us a message through [facility].  Take care, Dr Jimmey RalphParker

## 2018-09-24 MED FILL — MONTELUKAST SOD 10 MG TAB: 10 | 30 days supply | Qty: 30 | Fill #1

## 2018-09-28 MED FILL — metFORMIN HCL ER 500 MG TB2: 500 | 90 days supply | Qty: 270 | Fill #2

## 2018-10-01 ENCOUNTER — Telehealth: Payer: No Typology Code available for payment source | Admitting: Family

## 2018-10-01 DIAGNOSIS — L209 Atopic dermatitis, unspecified: Secondary | ICD-10-CM

## 2018-10-01 MED ORDER — TRIAMCINOLONE ACETONIDE 0.5 % EX OINT: 1.0000 "application " | TOPICAL_OINTMENT | Freq: Two times a day (BID) | CUTANEOUS | 0 refills | Status: DC

## 2018-10-01 MED FILL — TRIAMCINOLONE 0.5% OINTMENT: 0.5 | 10 days supply | Qty: 60 | Fill #0

## 2018-10-01 NOTE — Progress Notes (Signed)
E Visit for Rash  We are sorry that you are not feeling well. Here is how we plan to help!  Based on your questionnaire, it looks like you have eczema. I have sent to your pharmacy a prescription of Kenalog cream that she will use twice daily. Do not use on your face or groin   Apply cool compress or wet dressings.  Take a bath in an oatmeal bath.  Sprinkle content of one Aveeno packet under running faucet with comfortably warm water.  Bathe for 15-20 minutes, 1-2 times daily.  Pat dry with a towel. Do not rub the rash.  Use hydrocortisone cream.  Take an antihistamine like Benadryl for widespread rashesenadryl is 25-50 mg by mouth 4 times daily.  Caution:  This type of medication may cause sleepiness.  Do not drink alcohol, drive, or operate dangerous machinery while taking antihistamines.  Do not take these medications if you have prostate enlargement.  Read package instructions thoroughly on all medications that you take.  GET HELP RIGHT AWAY IF:   Symptoms don't go away after treatment.  Severe itching that persists.  If you rash spreads or swells.  If you rash begins to smell.  If it blisters and opens or develops a yellow-brown crust.  You develop a fever.  You have a sore throat.  You become short of breath.  MAKE SURE YOU:  Understand these instructions. Will watch your condition. Will get help right away if you are not doing well or get worse.  Thank you for choosing an e-visit. Your e-visit answers were reviewed by a board certified advanced clinical practitioner to complete your personal care plan. Depending upon the condition, your plan could have included both over the counter or prescription medications. Please review your pharmacy choice. Be sure that the pharmacy you have chosen is open so that you can pick up your prescription now.  If there is a problem you may message your provider in MyChart to have the prescription routed to another pharmacy. Your  safety is important to [place]. If you have drug allergies check your prescription carefully.  For the next 24 hours, you can use MyChart to ask questions about today's visit, request a non-urgent call back, or ask for a work or school excuse from your e-visit provider. You will get an email in the next two days asking about your experience. I hope that your e-visit has been valuable and will speed your recovery.

## 2018-10-25 ENCOUNTER — Ambulatory Visit (INDEPENDENT_AMBULATORY_CARE_PROVIDER_SITE_OTHER): Payer: No Typology Code available for payment source | Admitting: Family Medicine

## 2018-10-25 ENCOUNTER — Encounter: Payer: Self-pay | Admitting: Family Medicine

## 2018-10-25 VITALS — BP 130/78 | HR 73 | Temp 97.9°F | Ht 69.0 in | Wt 198.0 lb

## 2018-10-25 DIAGNOSIS — L309 Dermatitis, unspecified: Secondary | ICD-10-CM | POA: Diagnosis not present

## 2018-10-25 MED ORDER — PREDNISONE 50 MG PO TABS: ORAL_TABLET | ORAL | 0 refills | Status: DC

## 2018-10-25 MED FILL — predniSONE 50 MG TABS: 50 | 7 days supply | Qty: 6 | Fill #0

## 2018-10-25 NOTE — Assessment & Plan Note (Signed)
Given widespread distribution, will start prednisone burst.  He will continue using triamcinolone as needed.  Also recommended twice daily emollient therapy.  Discussed reasons to return to care.  If symptoms recur or do not improve with systemic steroids, will need referral to dermatology.

## 2018-10-25 NOTE — Patient Instructions (Signed)
It was very nice to see you today!  Please start the prednisone.  Please use lotion twice daily.  You can continue using triamcinolone and Benadryl as needed.  Let me know if your symptoms worsen or do not improve over the next few days.  Take care, Dr Jimmey RalphParker

## 2018-10-25 NOTE — Progress Notes (Signed)
   Subjective:  Cory Shaw is a 52 y.o. male who presents today with a chief complaint of eczema.   HPI:  Eczema, new problem Symptoms started about 2 months ago.  He was working out in the sun when he started noticing red rash on upper extremities and back.  He tried using hydrocortisone cream which has not helped.  Symptoms have worsened recently.  About 2 to 3 weeks ago, he had an e-visit where he was diagnosed with atopic dermatitis and started on topical triamcinolone.  This is helped with his symptoms, however he has not been able to reach the areas on his back.  Still has a significant amount of itching and burning to the area.  He takes Benadryl which helps some with itching.  No other obvious triggers.  He has not changed soaps or detergents recently.  No other obvious alleviating or aggravating factors.  ROS: Per HPI  PMH: He reports that he has never smoked. He has never used smokeless tobacco. He reports that he drinks about 1.0 standard drinks of alcohol per week. He reports that he does not use drugs.  Objective:  Physical Exam: BP 130/78 (BP Location: Left Arm, Patient Position: Sitting, Cuff Size: Normal)   Pulse 73   Temp 97.9 F (36.6 C) (Oral)   Ht 5\' 9"  (1.753 m)   Wt 198 lb (89.8 kg)   SpO2 97%   BMI 29.24 kg/m   Wt Readings from Last 3 Encounters:  10/25/18 198 lb (89.8 kg)  05/21/18 193 lb (87.5 kg)  11/18/17 215 lb 3.2 oz (97.6 kg)  Gen: NAD, resting comfortably Skin: Diffuse, erythematous,  well-demarcated plaques of varying size distributed on upper extremities and back.  Assessment/Plan:  Eczema Given widespread distribution, will start prednisone burst.  He will continue using triamcinolone as needed.  Also recommended twice daily emollient therapy.  Discussed reasons to return to care.  If symptoms recur or do not improve with systemic steroids, will need referral to dermatology.  Katina Degreealeb M. Jimmey RalphParker, MD 10/25/2018 9:22 AM

## 2018-10-29 ENCOUNTER — Encounter: Payer: Self-pay | Admitting: Family Medicine

## 2018-11-08 ENCOUNTER — Other Ambulatory Visit: Payer: Self-pay

## 2018-11-08 MED ORDER — PREDNISONE 50 MG PO TABS: ORAL_TABLET | ORAL | 0 refills | Status: DC

## 2018-11-08 MED FILL — predniSONE 50 MG TABS: 50 | 7 days supply | Qty: 6 | Fill #0

## 2018-11-10 ENCOUNTER — Other Ambulatory Visit: Payer: Self-pay

## 2018-11-10 DIAGNOSIS — L309 Dermatitis, unspecified: Secondary | ICD-10-CM

## 2018-11-15 NOTE — Telephone Encounter (Signed)
Attempted to call patient. Left voicemail requesting call back.

## 2018-11-16 ENCOUNTER — Ambulatory Visit: Payer: No Typology Code available for payment source | Admitting: Family Medicine

## 2018-11-18 ENCOUNTER — Ambulatory Visit (INDEPENDENT_AMBULATORY_CARE_PROVIDER_SITE_OTHER): Payer: No Typology Code available for payment source | Admitting: Family Medicine

## 2018-11-18 ENCOUNTER — Encounter: Payer: Self-pay | Admitting: Family Medicine

## 2018-11-18 DIAGNOSIS — L309 Dermatitis, unspecified: Secondary | ICD-10-CM

## 2018-11-18 MED ORDER — PREDNISONE 20 MG PO TABS: 20.0000 mg | ORAL_TABLET | Freq: Every day | ORAL | 0 refills | Status: DC

## 2018-11-18 MED FILL — predniSONE 20 MG TABS: 20 | 30 days supply | Qty: 30 | Fill #0

## 2018-11-18 NOTE — Assessment & Plan Note (Signed)
Improved since his last visit however still with significant symptoms.  We will send in prednisone 20 mg daily for the next few weeks, hopefully will be able to taper off as his flare resolves.  He will continue using topical triamcinolone as needed as well.  Also discussed bleach baths to help reduce skin bacteria and fungus burden which could possibly help with his eczema.  May be a candidate for immunologic agent.  Has dermatology referral pending.  Discussed reasons to return to care.

## 2018-11-18 NOTE — Progress Notes (Signed)
   Subjective:  Cory Shaw is a 52 y.o. male who presents today with a chief complaint of rash.   HPI:  Rash, established problem Patient seen about 3 weeks ago for widespread severe eczema.  He was started on a course of oral prednisone for 7 days with significant improvement however no clear resolution of his symptoms.  He was then given a second course of prednisone with significant improvement in his symptoms.  Still has occasional lesions on his back and upper extremities.  Occasionally pruritic.  Has been taking Benadryl as needed which helps with itch.  He has had to have chronic prednisone in the past for his eczema.  He has a dermatology referral pending and will be seeing them in about 3 weeks.  ROS: Per HPI  PMH: He reports that he has never smoked. He has never used smokeless tobacco. He reports current alcohol use of about 1.0 standard drinks of alcohol per week. He reports that he does not use drugs.  Objective:  Physical Exam: BP 122/79 (BP Location: Left Arm, Patient Position: Sitting, Cuff Size: Large)   Pulse 60   Temp 97.6 F (36.4 C) (Oral)   Ht 5\' 9"  (1.753 m)   Wt 208 lb 3.2 oz (94.4 kg)   SpO2 99%   BMI 30.75 kg/m   Wt Readings from Last 3 Encounters:  11/18/18 208 lb 3.2 oz (94.4 kg)  10/25/18 198 lb (89.8 kg)  05/21/18 193 lb (87.5 kg)  Gen: NAD, resting comfortably Skin: Diffused, erytematous well demarcated plaques on back and upper extremities, improved since last visit.   Assessment/Plan:  Eczema Improved since his last visit however still with significant symptoms.  We will send in prednisone 20 mg daily for the next few weeks, hopefully will be able to taper off as his flare resolves.  He will continue using topical triamcinolone as needed as well.  Also discussed bleach baths to help reduce skin bacteria and fungus burden which could possibly help with his eczema.  May be a candidate for immunologic agent.  Has dermatology referral pending.   Discussed reasons to return to care.  Time Spent: I spent >15 minutes face-to-face with the patient, with more than half spent on counseling for treatment plan for his eczema.   Katina Degreealeb M. Jimmey RalphParker, MD 11/18/2018 4:15 PM

## 2018-11-18 NOTE — Patient Instructions (Signed)
It was very nice to see you today!  I will send in 30 days of prednisone 20mg  daily.  Please try the bleach bath.  Use the triamcinolone as needed for difficult to treat areas.   Take care, Dr Jimmey RalphParker

## 2018-12-03 MED FILL — MONTELUKAST SOD 10 MG TAB: 10 | 30 days supply | Qty: 30 | Fill #2

## 2019-01-10 DIAGNOSIS — L209 Atopic dermatitis, unspecified: Secondary | ICD-10-CM | POA: Diagnosis not present

## 2019-01-10 MED FILL — HALOBETASOL PROP 0.05% OINT: 0.05 | 30 days supply | Qty: 50 | Fill #0

## 2019-02-25 ENCOUNTER — Telehealth: Payer: No Typology Code available for payment source | Admitting: Family

## 2019-02-25 DIAGNOSIS — H109 Unspecified conjunctivitis: Secondary | ICD-10-CM

## 2019-02-25 MED ORDER — POLYMYXIN B-TRIMETHOPRIM 10000-0.1 UNIT/ML-% OP SOLN: 1.0000 [drp] | Freq: Four times a day (QID) | OPHTHALMIC | 0 refills | Status: DC

## 2019-02-25 MED FILL — POLYMYXIN B/TMP EYE DROPS: 10000-0.1 | 50 days supply | Qty: 10 | Fill #0

## 2019-02-25 NOTE — Progress Notes (Signed)

## 2019-02-28 ENCOUNTER — Telehealth: Payer: No Typology Code available for payment source | Admitting: Family

## 2019-02-28 DIAGNOSIS — H109 Unspecified conjunctivitis: Secondary | ICD-10-CM

## 2019-02-28 NOTE — Progress Notes (Signed)
Based on what you shared with me, I feel your condition warrants further evaluation and I recommend that you be seen for a face to face office visit.     NOTE: If you entered your credit card information for this eVisit, you will not be charged. You may see a "hold" on your card for the $35 but that hold will drop off and you will not have a charge processed.  If you are having a true medical emergency please call 911.  If you need an urgent face to face visit, [facility] has four urgent care centers for your convenience.    PLEASE NOTE: THE [facility] LOCATIONS AND URGENT CARE CLINICS DO NOT HAVE THE TESTING FOR CORONAVIRUS COVID19 AVAILABLE.  IF YOU FEEL YOU NEED THIS TEST YOU MUST HAVE AN ORDER TO GO TO A TESTING LOCATION FROM YOUR PROVIDER OR FROM A SCREENING E-VISIT     [email or web address] to reserve your spot online an avoid wait times  [facility] [address], [address] [place], [place] [address] 8 am to 8 pm Monday-Friday 10 am to 4 pm Saturday-Sunday *Across the street from [employer]  [facility]  [address] [place], [address] 8 am to 5 pm Monday-Friday * In the [facility] the [facility]   The following sites will take your insurance:  . [facility]  [phone] Get Driving Directions Find a Provider at this Location  [address] [place], [place] [address] . 10 am to 8 pm Monday-Friday . 12 pm to 8 pm Saturday-Sunday   . [facility]  [phone] Get Driving Directions Find a Provider at this Location  [address], [address] , [place] [address] . 8 am to 8 pm Monday-Friday . 9 am to 6 pm Saturday . 11 am to 6 pm Sunday   . [facility] [place]  [phone] Get Driving Directions  [address] [place],  [address] . 8 am to 8 pm Monday-Friday . 8 am to 4 pm Saturday-Sunday   Your  e-visit answers were reviewed by a board certified advanced clinical practitioner to complete your personal care plan.  Thank you for using e-Visits. 

## 2019-04-05 DIAGNOSIS — M9902 Segmental and somatic dysfunction of thoracic region: Secondary | ICD-10-CM | POA: Diagnosis not present

## 2019-04-05 DIAGNOSIS — M6240 Contracture of muscle, unspecified site: Secondary | ICD-10-CM | POA: Diagnosis not present

## 2019-04-05 DIAGNOSIS — M9901 Segmental and somatic dysfunction of cervical region: Secondary | ICD-10-CM | POA: Diagnosis not present

## 2019-04-07 DIAGNOSIS — M9902 Segmental and somatic dysfunction of thoracic region: Secondary | ICD-10-CM | POA: Diagnosis not present

## 2019-04-07 DIAGNOSIS — M9901 Segmental and somatic dysfunction of cervical region: Secondary | ICD-10-CM | POA: Diagnosis not present

## 2019-04-07 DIAGNOSIS — M6240 Contracture of muscle, unspecified site: Secondary | ICD-10-CM | POA: Diagnosis not present

## 2019-04-13 DIAGNOSIS — M6240 Contracture of muscle, unspecified site: Secondary | ICD-10-CM | POA: Diagnosis not present

## 2019-04-13 DIAGNOSIS — M9902 Segmental and somatic dysfunction of thoracic region: Secondary | ICD-10-CM | POA: Diagnosis not present

## 2019-04-13 DIAGNOSIS — M9901 Segmental and somatic dysfunction of cervical region: Secondary | ICD-10-CM | POA: Diagnosis not present

## 2019-04-15 DIAGNOSIS — M6240 Contracture of muscle, unspecified site: Secondary | ICD-10-CM | POA: Diagnosis not present

## 2019-04-15 DIAGNOSIS — M9902 Segmental and somatic dysfunction of thoracic region: Secondary | ICD-10-CM | POA: Diagnosis not present

## 2019-04-15 DIAGNOSIS — M9901 Segmental and somatic dysfunction of cervical region: Secondary | ICD-10-CM | POA: Diagnosis not present

## 2019-04-19 DIAGNOSIS — M9902 Segmental and somatic dysfunction of thoracic region: Secondary | ICD-10-CM | POA: Diagnosis not present

## 2019-04-19 DIAGNOSIS — M6240 Contracture of muscle, unspecified site: Secondary | ICD-10-CM | POA: Diagnosis not present

## 2019-04-19 DIAGNOSIS — M9901 Segmental and somatic dysfunction of cervical region: Secondary | ICD-10-CM | POA: Diagnosis not present

## 2019-04-21 DIAGNOSIS — M9902 Segmental and somatic dysfunction of thoracic region: Secondary | ICD-10-CM | POA: Diagnosis not present

## 2019-04-21 DIAGNOSIS — M9901 Segmental and somatic dysfunction of cervical region: Secondary | ICD-10-CM | POA: Diagnosis not present

## 2019-04-28 DIAGNOSIS — M9902 Segmental and somatic dysfunction of thoracic region: Secondary | ICD-10-CM | POA: Diagnosis not present

## 2019-04-28 DIAGNOSIS — M9901 Segmental and somatic dysfunction of cervical region: Secondary | ICD-10-CM | POA: Diagnosis not present

## 2019-06-01 ENCOUNTER — Telehealth: Payer: Self-pay | Admitting: Family Medicine

## 2019-06-01 ENCOUNTER — Ambulatory Visit (INDEPENDENT_AMBULATORY_CARE_PROVIDER_SITE_OTHER): Payer: 59 | Admitting: Family Medicine

## 2019-06-01 ENCOUNTER — Other Ambulatory Visit: Payer: Self-pay

## 2019-06-01 ENCOUNTER — Encounter: Payer: Self-pay | Admitting: Family Medicine

## 2019-06-01 VITALS — BP 118/82 | HR 62 | Temp 98.3°F | Ht 69.0 in | Wt 205.8 lb

## 2019-06-01 DIAGNOSIS — G8929 Other chronic pain: Secondary | ICD-10-CM

## 2019-06-01 DIAGNOSIS — Z0001 Encounter for general adult medical examination with abnormal findings: Secondary | ICD-10-CM

## 2019-06-01 DIAGNOSIS — Z13220 Encounter for screening for lipoid disorders: Secondary | ICD-10-CM | POA: Diagnosis not present

## 2019-06-01 DIAGNOSIS — E1165 Type 2 diabetes mellitus with hyperglycemia: Secondary | ICD-10-CM

## 2019-06-01 DIAGNOSIS — Z1211 Encounter for screening for malignant neoplasm of colon: Secondary | ICD-10-CM | POA: Diagnosis not present

## 2019-06-01 DIAGNOSIS — Z6830 Body mass index (BMI) 30.0-30.9, adult: Secondary | ICD-10-CM

## 2019-06-01 DIAGNOSIS — J9610 Chronic respiratory failure, unspecified whether with hypoxia or hypercapnia: Secondary | ICD-10-CM | POA: Diagnosis not present

## 2019-06-01 DIAGNOSIS — M25511 Pain in right shoulder: Secondary | ICD-10-CM

## 2019-06-01 DIAGNOSIS — D696 Thrombocytopenia, unspecified: Secondary | ICD-10-CM

## 2019-06-01 LAB — MICROALBUMIN / CREATININE URINE RATIO
Creatinine,U: 126.4 mg/dL
Microalb Creat Ratio: 0.6 mg/g (ref 0.0–30.0)
Microalb, Ur: 0.7 mg/dL (ref 0.0–1.9)

## 2019-06-01 MED ORDER — DICLOFENAC SODIUM 75 MG PO TBEC: 75.0000 mg | DELAYED_RELEASE_TABLET | Freq: Two times a day (BID) | ORAL | 0 refills | Status: DC

## 2019-06-01 MED ORDER — DICLOFENAC SODIUM 1.5 % TD SOLN: TRANSDERMAL | 1 refills | Status: DC

## 2019-06-01 MED FILL — DICLOFENAC SODIUM 75 MG TAB: 75 | 15 days supply | Qty: 30 | Fill #0

## 2019-06-01 NOTE — Telephone Encounter (Signed)
Cory Shaw from [facility] called regarding medication Diclofenac Sodium 1.5 % SOLN  Pharmacy needs to know how much to use for insurance purposes. Cory Shaw call back #  [phone]

## 2019-06-01 NOTE — Assessment & Plan Note (Signed)
Stable.  Satting 97% on room air.  Normal pulmonary exam.

## 2019-06-01 NOTE — Progress Notes (Signed)
Chief Complaint:  Cory Shaw is a 53 y.o. male who presents today for his annual comprehensive physical exam.    Assessment/Plan:  Chronic respiratory failure Stable.  Satting 97% on room air.  Normal pulmonary exam.  Thrombocytopenia (HCC) Check CBC.  Type 2 diabetes mellitus with hyperglycemia (HCC) Stable off medications. Check A1c, CBC, C met, and TSH.  Check urine microalbumin.   BMI 30 Discussed lifestyle modifications.   Preventative Healthcare: Check cologuard. Advised pt to get eye exam soon.  Patient Counseling(The following topics were reviewed and/or handout was given):  -Nutrition: Stressed importance of moderation in sodium/caffeine intake, saturated fat and cholesterol, caloric balance, sufficient intake of fresh fruits, vegetables, and fiber.  -Stressed the importance of regular exercise.   -Substance Abuse: Discussed cessation/primary prevention of tobacco, alcohol, or other drug use; driving or other dangerous activities under the influence; availability of treatment for abuse.   -Injury prevention: Discussed safety belts, safety helmets, smoke detector, smoking near bedding or upholstery.   -Sexuality: Discussed sexually transmitted diseases, partner selection, use of condoms, avoidance of unintended pregnancy and contraceptive alternatives.   -Dental health: Discussed importance of regular tooth brushing, flossing, and dental visits.  -Health maintenance and immunizations reviewed. Please refer to Health maintenance section.  Return to care in 1 year for next preventative visit.     Subjective:  HPI:  He has no acute complaints today.   # Right Shoulder Pain - History of rotator cuff tear - Worsened recently due to lack of activity - Ibuprofen does not significantly help.   Lifestyle Diet: Tries to eat a healthy, low carb diet.  Exercise: No current exercise regimens. Likes to go hiking.   Depression screen PHQ 2/9 06/01/2019  Decreased  Interest 0  Down, Depressed, Hopeless 0  PHQ - 2 Score 0    Health Maintenance Due  Topic Date Due  . HIV Screening  01/30/1983  . COLONOSCOPY  01/30/2018  . OPHTHALMOLOGY EXAM  08/03/2018  . HEMOGLOBIN A1C  11/20/2018  . URINE MICROALBUMIN  05/22/2019     ROS: Per HPI, otherwise a complete review of systems was negative.   PMH:  The following were reviewed and entered/updated in epic: Past Medical History:  Diagnosis Date  . Allergy   . Pneumonia 2010  . PONV (postoperative nausea and vomiting)    No amnesia causing drugs  . Seasonal allergies    Patient Active Problem List   Diagnosis Date Noted  . Eczema 10/25/2018  . Bilateral shoulder pain 05/21/2018  . Type 2 diabetes mellitus with hyperglycemia ([place]) 10/15/2017  . Thrombocytopenia ([place]) 10/15/2017  . Abnormal transaminases 10/15/2017  . Accessory bone of foot 08/27/2017  . Chronic rhinitis 07/23/2012  . Chronic respiratory failure ([facility]) 07/23/2012  . Pulmonary infiltrates 06/22/2012   Past Surgical History:  Procedure Laterality Date  . HERNIA REPAIR    . transpositon of ulna nerve Left 2009    Family History  Problem Relation Age of Onset  . Other Brother        NETS  . Diabetes Mother   . Pneumonia Mother   . Atrial fibrillation Mother   . Heart attack Maternal Grandfather     Medications- reviewed and updated Current Outpatient Medications  Medication Sig Dispense Refill  . cetirizine (ZYRTEC) 10 MG tablet Take 10 mg by mouth at bedtime.     Marland Kitchen ibuprofen (ADVIL,MOTRIN) 200 MG tablet Take 400 mg by mouth at bedtime.    . Multiple Vitamin (MULTIVITAMIN) capsule Take  1 capsule by mouth daily.    . diclofenac (VOLTAREN) 75 MG EC tablet Take 1 tablet (75 mg total) by mouth 2 (two) times daily. 30 tablet 0  . Diclofenac Sodium 1.5 % SOLN Uses 4 times per day as needed. 150 mL 1   No current facility-administered medications for this visit.     Allergies-reviewed and updated Allergies  Allergen  Reactions  . Sulfa Antibiotics Hives    Social History   Socioeconomic History  . Marital status: Married    Spouse name: Not on file  . Number of children: Not on file  . Years of education: Not on file  . Highest education level: Not on file  Occupational History  . Occupation: Sales executive for [employer]: [place]  . Financial resource strain: Not on file  . Food insecurity    Worry: Not on file    Inability: Not on file  . Transportation needs    Medical: Not on file    Non-medical: Not on file  Tobacco Use  . Smoking status: Never Smoker  . Smokeless tobacco: Never Used  Substance and Sexual Activity  . Alcohol use: Yes    Alcohol/week: 1.0 standard drinks    Types: 1 Cans of beer per week  . Drug use: No  . Sexual activity: Yes  Lifestyle  . Physical activity    Days per week: Not on file    Minutes per session: Not on file  . Stress: Not on file  Relationships  . Social Herbalist on phone: Not on file    Gets together: Not on file    Attends religious service: Not on file    Active member of club or organization: Not on file    Attends meetings of clubs or organizations: Not on file    Relationship status: Not on file  Other Topics Concern  . Not on file  Social History Narrative  . Not on file        Objective:  Physical Exam: BP 118/82 (BP Location: Left Arm, Patient Position: Sitting, Cuff Size: Large)   Pulse 62   Temp 98.3 F (36.8 C) (Oral)   Ht '5\' 9"'  (1.753 m)   Wt 205 lb 12 oz (93.3 kg)   SpO2 97%   BMI 30.38 kg/m   Body mass index is 30.38 kg/m. Wt Readings from Last 3 Encounters:  06/01/19 205 lb 12 oz (93.3 kg)  11/18/18 208 lb 3.2 oz (94.4 kg)  10/25/18 198 lb (89.8 kg)   Gen: NAD, resting comfortably HEENT: TMs normal bilaterally. OP clear. No thyromegaly noted.  CV: RRR with no murmurs appreciated Pulm: NWOB, CTAB with no crackles, wheezes, or rhonchi GI: Normal bowel sounds present.  Soft, Nontender, Nondistended. MSK: no edema, cyanosis, or clubbing noted.  Right shoulder pain elicited with overhead motion. Skin: warm, dry Neuro: CN2-12 grossly intact. Strength 5/5 in upper and lower extremities. Reflexes symmetric and intact bilaterally.  Psych: Normal affect and thought content     Gaynelle Pastrana M. Jerline Pain, MD 06/01/2019 10:04 AM

## 2019-06-01 NOTE — Patient Instructions (Signed)
It was very nice to see you today!  I am glad you are doing well.  Please keep up the good work with your diet and exercise.  Please try the diclofenac and Pennsaid for your shoulder.  Please get back to working on exercises when you are able to.  We will check blood work and a urine sample today.  I will place an order for you to get your cologuard.   Come back to see me in 1 year for your next physical, or sooner as needed.   Eat at least 3 REAL meals and 1-2 snacks per day.  Aim for no more than 5 hours between eating.  Eat breakfast within one hour of getting up.    Obtain twice as many fruits/vegetables as protein or carbohydrate foods for both lunch and dinner.   Cut down on sweet beverages. This includes juice, soda, and sweet tea.    Exercise at least 150 minutes every week.   Take care, Dr Jerline Pain   Preventive Care 40-64 Years, Male Preventive care refers to lifestyle choices and visits with your health care provider that can promote health and wellness. What does preventive care include?   A yearly physical exam. This is also called an annual well check.  Dental exams once or twice a year.  Routine eye exams. Ask your health care provider how often you should have your eyes checked.  Personal lifestyle choices, including: ? Daily care of your teeth and gums. ? Regular physical activity. ? Eating a healthy diet. ? Avoiding tobacco and drug use. ? Limiting alcohol use. ? Practicing safe sex. ? Taking low-dose aspirin every day starting at age 57. What happens during an annual well check? The services and screenings done by your health care provider during your annual well check will depend on your age, overall health, lifestyle risk factors, and family history of disease. Counseling Your health care provider may ask you questions about your:  Alcohol use.  Tobacco use.  Drug use.  Emotional well-being.  Home and relationship well-being.  Sexual  activity.  Eating habits.  Work and work Statistician. Screening You may have the following tests or measurements:  Height, weight, and BMI.  Blood pressure.  Lipid and cholesterol levels. These may be checked every 5 years, or more frequently if you are over 67 years old.  Skin check.  Lung cancer screening. You may have this screening every year starting at age 35 if you have a 30-pack-year history of smoking and currently smoke or have quit within the past 15 years.  Colorectal cancer screening. All adults should have this screening starting at age 41 and continuing until age 39. Your health care provider may recommend screening at age 4. You will have tests every 1-10 years, depending on your results and the type of screening test. People at increased risk should start screening at an earlier age. Screening tests may include: ? Guaiac-based fecal occult blood testing. ? Fecal immunochemical test (FIT). ? Stool DNA test. ? Virtual colonoscopy. ? Sigmoidoscopy. During this test, a flexible tube with a tiny camera (sigmoidoscope) is used to examine your rectum and lower colon. The sigmoidoscope is inserted through your anus into your rectum and lower colon. ? Colonoscopy. During this test, a long, thin, flexible tube with a tiny camera (colonoscope) is used to examine your entire colon and rectum.  Prostate cancer screening. Recommendations will vary depending on your family history and other risks.  Hepatitis C blood test.  Hepatitis B blood test.  Sexually transmitted disease (STD) testing.  Diabetes screening. This is done by checking your blood sugar (glucose) after you have not eaten for a while (fasting). You may have this done every 1-3 years. Discuss your test results, treatment options, and if necessary, the need for more tests with your health care provider. Vaccines Your health care provider may recommend certain vaccines, such as:  Influenza vaccine. This is  recommended every year.  Tetanus, diphtheria, and acellular pertussis (Tdap, Td) vaccine. You may need a Td booster every 10 years.  Varicella vaccine. You may need this if you have not been vaccinated.  Zoster vaccine. You may need this after age 66.  Measles, mumps, and rubella (MMR) vaccine. You may need at least one dose of MMR if you were born in 1957 or later. You may also need a second dose.  Pneumococcal 13-valent conjugate (PCV13) vaccine. You may need this if you have certain conditions and have not been vaccinated.  Pneumococcal polysaccharide (PPSV23) vaccine. You may need one or two doses if you smoke cigarettes or if you have certain conditions.  Meningococcal vaccine. You may need this if you have certain conditions.  Hepatitis A vaccine. You may need this if you have certain conditions or if you travel or work in places where you may be exposed to hepatitis A.  Hepatitis B vaccine. You may need this if you have certain conditions or if you travel or work in places where you may be exposed to hepatitis B.  Haemophilus influenzae type b (Hib) vaccine. You may need this if you have certain risk factors. Talk to your health care provider about which screenings and vaccines you need and how often you need them. This information is not intended to replace advice given to you by your health care provider. Make sure you discuss any questions you have with your health care provider. Document Released: 12/21/2015 Document Revised: 01/14/2018 Document Reviewed: 09/25/2015 Elsevier Interactive Patient Education  2019 [employer].

## 2019-06-01 NOTE — Telephone Encounter (Signed)
Please advise 

## 2019-06-01 NOTE — Assessment & Plan Note (Signed)
Check CBC 

## 2019-06-01 NOTE — Assessment & Plan Note (Addendum)
Stable off medications. Check A1c, CBC, C met, and TSH.  Check urine microalbumin.

## 2019-06-02 ENCOUNTER — Other Ambulatory Visit: Payer: Self-pay

## 2019-06-02 ENCOUNTER — Other Ambulatory Visit (INDEPENDENT_AMBULATORY_CARE_PROVIDER_SITE_OTHER): Payer: 59

## 2019-06-02 DIAGNOSIS — Z0001 Encounter for general adult medical examination with abnormal findings: Secondary | ICD-10-CM

## 2019-06-02 DIAGNOSIS — E1165 Type 2 diabetes mellitus with hyperglycemia: Secondary | ICD-10-CM | POA: Diagnosis not present

## 2019-06-02 LAB — COMPREHENSIVE METABOLIC PANEL
ALT: 30 U/L (ref 0–53)
AST: 24 U/L (ref 0–37)
Albumin: 4.7 g/dL (ref 3.5–5.2)
Alkaline Phosphatase: 41 U/L (ref 39–117)
BUN: 25 mg/dL — ABNORMAL HIGH (ref 6–23)
CO2: 26 mEq/L (ref 19–32)
Calcium: 9.5 mg/dL (ref 8.4–10.5)
Chloride: 102 mEq/L (ref 96–112)
Creatinine, Ser: 0.99 mg/dL (ref 0.40–1.50)
GFR: 79.59 mL/min (ref >60.00)
Glucose, Bld: 107 mg/dL — ABNORMAL HIGH (ref 70–99)
Potassium: 4 mEq/L (ref 3.5–5.1)
Sodium: 138 mEq/L (ref 135–145)
Total Bilirubin: 1 mg/dL (ref 0.2–1.2)
Total Protein: 7.2 g/dL (ref 6.0–8.3)

## 2019-06-02 LAB — CBC
HCT: 40.4 % (ref 39.0–52.0)
Hemoglobin: 14 g/dL (ref 13.0–17.0)
MCHC: 34.6 g/dL (ref 30.0–36.0)
MCV: 88.5 fl (ref 78.0–100.0)
Platelets: 143 10*3/uL — ABNORMAL LOW (ref 150.0–400.0)
RBC: 4.57 Mil/uL (ref 4.22–5.81)
RDW: 13.1 % (ref 11.5–15.5)
WBC: 5 10*3/uL (ref 4.0–10.5)

## 2019-06-02 LAB — LIPID PANEL
Cholesterol: 226 mg/dL — ABNORMAL HIGH (ref 0–200)
HDL: 50.3 mg/dL (ref >39.00)
LDL Cholesterol: 138 mg/dL — ABNORMAL HIGH (ref 0–99)
NonHDL: 175.75
Total CHOL/HDL Ratio: 4
Triglycerides: 188 mg/dL — ABNORMAL HIGH (ref 0.0–149.0)
VLDL: 37.6 mg/dL (ref 0.0–40.0)

## 2019-06-02 LAB — HEMOGLOBIN A1C: Hgb A1c MFr Bld: 5.7 % (ref 4.6–6.5)

## 2019-06-02 LAB — TSH: TSH: 1.59 u[IU]/mL (ref 0.35–4.50)

## 2019-06-02 MED ORDER — DICLOFENAC SODIUM 1.5 % TD SOLN: TRANSDERMAL | 1 refills | Status: DC

## 2019-06-02 NOTE — Progress Notes (Signed)
Please inform patient of the following:  Urine sample is NORMAL. He needs to come back to have blood work drawn.  Algis Greenhouse. Jerline Pain, MD 06/02/2019 8:26 AM

## 2019-06-02 NOTE — Telephone Encounter (Signed)
Apply 2-4g 4 times daily as needed.

## 2019-06-02 NOTE — Telephone Encounter (Signed)
Sent new Rx to pharmacy 

## 2019-06-02 NOTE — Telephone Encounter (Signed)
Pharmacy called to get clarification on the directions of the Diclofenac Sodium 1.5 % SOLN states that the direction would work with the gel but for the direction that came with the solution are incorrect. Please advise

## 2019-06-03 ENCOUNTER — Encounter: Payer: Self-pay | Admitting: Family Medicine

## 2019-06-03 DIAGNOSIS — E785 Hyperlipidemia, unspecified: Secondary | ICD-10-CM | POA: Insufficient documentation

## 2019-06-03 DIAGNOSIS — E1169 Type 2 diabetes mellitus with other specified complication: Secondary | ICD-10-CM | POA: Insufficient documentation

## 2019-06-03 DIAGNOSIS — R739 Hyperglycemia, unspecified: Secondary | ICD-10-CM | POA: Insufficient documentation

## 2019-06-03 DIAGNOSIS — E119 Type 2 diabetes mellitus without complications: Secondary | ICD-10-CM | POA: Insufficient documentation

## 2019-06-03 NOTE — Progress Notes (Signed)
Please inform patient of the following:  Blood sugar and cholesterol are borderline elevated. Do not need to start meds but recommend working on diet and exercise and we can recheck in a year.   Everything else is normal.

## 2019-06-06 DIAGNOSIS — Z0001 Encounter for general adult medical examination with abnormal findings: Secondary | ICD-10-CM | POA: Diagnosis not present

## 2019-06-06 DIAGNOSIS — Z1211 Encounter for screening for malignant neoplasm of colon: Secondary | ICD-10-CM | POA: Diagnosis not present

## 2019-06-06 NOTE — Telephone Encounter (Signed)
See note

## 2019-06-06 NOTE — Telephone Encounter (Signed)
New Rx sent.

## 2019-06-09 LAB — COLOGUARD: Cologuard: NEGATIVE

## 2019-06-29 ENCOUNTER — Encounter: Payer: Self-pay | Admitting: Family Medicine

## 2019-07-25 ENCOUNTER — Ambulatory Visit (INDEPENDENT_AMBULATORY_CARE_PROVIDER_SITE_OTHER): Payer: 59 | Admitting: Orthopedic Surgery

## 2019-07-25 ENCOUNTER — Encounter: Payer: Self-pay | Admitting: Orthopedic Surgery

## 2019-07-25 ENCOUNTER — Ambulatory Visit (INDEPENDENT_AMBULATORY_CARE_PROVIDER_SITE_OTHER): Payer: 59

## 2019-07-25 DIAGNOSIS — M25512 Pain in left shoulder: Secondary | ICD-10-CM

## 2019-07-26 ENCOUNTER — Encounter: Payer: Self-pay | Admitting: Orthopedic Surgery

## 2019-07-26 NOTE — Progress Notes (Signed)
Office Visit Note   Patient: Cory Shaw           Date of Birth: 03/02/1966           MRN: [id number] Visit Date: 07/25/2019 Requested by: Ardith DarkParker, Caleb M, MD [address] [place] [id number] PCP: Ardith DarkParker, Caleb M, MD  Subjective: Chief Complaint  Patient presents with  . Left Shoulder - Pain    HPI: Cory Shaw is a 53 y.o. male who presents to the office complaining of left shoulder pain.  Patient notes acute left shoulder pain that began last Thursday when he was pushing a door open.  Patient notes a pop with sudden onset of severe left shoulder pain with decreased range of motion due to pain.  The pain made it difficult to sleep and woke him up at night.  Pain is his main complaint.  His pain was significantly improved on Sunday when he closed a car door and heard a subsequent pop that relieved his pain.  His pain has now reduced to a dull soreness with worsening pain with extremes of range of motion.  Patient has a history of bilateral shoulder pain and states he has had small rotator cuff tears bilaterally, that he has treated successfully with physical therapy.  Denies profound weakness.  Denies numbness or tingling or neck pain.  He has tried Advil, Tylenol, diclofenac, sling, ice with some improvement.  He works as an Optician, dispensingT analyst.              ROS:  All systems reviewed are negative as they relate to the chief complaint within the history of present illness.  Patient denies fevers or chills.  Assessment & Plan: Visit Diagnoses:  1. Left shoulder pain, unspecified chronicity     Plan: Patient is a 53 year old male who presents complaining of acute on chronic left shoulder pain.  He has a history of bilateral shoulder pain left greater than right.  He reports a history of rotator cuff tear that was not treated with surgery.  X-rays taken today in clinic reveal no significant arthritis or chronic findings of rotator cuff tear.  Exam reveals pain and weakness with  resistance testing of the biceps and subscap, with severe pain at terminal external rotation.  Ultrasound was applied to the left shoulder and revealed an irregularity in the left subscapularis tendon when compared to the contralateral side.  Recommended MRI arthrogram of the left shoulder to evaluate for subscap tear.  Patient agreed with the plan will follow-up after MRI results.  Follow-Up Instructions: No follow-ups on file.   Orders:  Orders Placed This Encounter  Procedures  . XR Shoulder Left  . MR Shoulder Left w/ contrast  . Arthrogram   No orders of the defined types were placed in this encounter.     Procedures: No procedures performed   Clinical Data: No additional findings.  Objective: Vital Signs: There were no vitals taken for this visit.  Physical Exam:  Constitutional: Patient appears well-developed HEENT:  Head: Normocephalic Eyes:EOM are normal Neck: Normal range of motion Cardiovascular: Normal rate Pulmonary/chest: Effort normal Neurologic: Patient is alert Skin: Skin is warm Psychiatric: Patient has normal mood and affect  Ortho Exam:  Left shoulder Exam Able to fully forward flex and abduct shoulder overhead No loss of ER relative to the other shoulder.  Significant pain at terminal external rotation with guarding No TTP over the [employer] joint Significant TTP over the bicipital groove 5/5 motor strength  of the supraspinatus and infraspinatus 4/5 motor strength of the subscapularis Negative Hawkins impingement 5/5 grip strength, forearm pronation/supination, and bicep strength; moderate pain with resisted supination and elbow flexion.  Specialty Comments:  No specialty comments available.  Imaging: No results found.   PMFS History: Patient Active Problem List   Diagnosis Date Noted  . Dyslipidemia 06/03/2019  . Hyperglycemia 06/03/2019  . Eczema 10/25/2018  . Bilateral shoulder pain 05/21/2018  . Type 2 diabetes mellitus with  hyperglycemia ([facility]) 10/15/2017  . Thrombocytopenia ([facility]) 10/15/2017  . Abnormal transaminases 10/15/2017  . Accessory bone of foot 08/27/2017  . Chronic rhinitis 07/23/2012  . Chronic respiratory failure ([facility]) 07/23/2012  . Pulmonary infiltrates 06/22/2012   Past Medical History:  Diagnosis Date  . Allergy   . Pneumonia 2010  . PONV (postoperative nausea and vomiting)    No amnesia causing drugs  . Seasonal allergies     Family History  Problem Relation Age of Onset  . Other Brother        NETS  . Diabetes Mother   . Pneumonia Mother   . Atrial fibrillation Mother   . Heart attack Maternal Grandfather     Past Surgical History:  Procedure Laterality Date  . HERNIA REPAIR    . transpositon of ulna nerve Left 2009   Social History   Occupational History  . Occupation: Sales executive for [employer]: [place]  Tobacco Use  . Smoking status: Never Smoker  . Smokeless tobacco: Never Used  Substance and Sexual Activity  . Alcohol use: Yes    Alcohol/week: 1.0 standard drinks    Types: 1 Cans of beer per week  . Drug use: No  . Sexual activity: Yes

## 2019-07-27 ENCOUNTER — Encounter: Payer: Self-pay | Admitting: Orthopedic Surgery

## 2019-07-29 ENCOUNTER — Telehealth: Payer: Self-pay | Admitting: *Deleted

## 2019-07-29 NOTE — Telephone Encounter (Signed)
I called pt to see if he was ok with being scheduled at [facility] and pt agreed that it wouild be ok and I informed him that I had sent over a skype message to [place] with the [facility] to see if there were any openings for tomorrow and if so pt is ok to go them.

## 2019-08-04 ENCOUNTER — Ambulatory Visit
Admission: RE | Admit: 2019-08-04 | Discharge: 2019-08-04 | Disposition: A | Discharge status: Home / Self Care | Payer: 59 | Source: Ambulatory Visit | Attending: Orthopedic Surgery | Admitting: Orthopedic Surgery

## 2019-08-04 ENCOUNTER — Other Ambulatory Visit: Payer: Self-pay

## 2019-08-04 DIAGNOSIS — M25511 Pain in right shoulder: Secondary | ICD-10-CM | POA: Diagnosis not present

## 2019-08-04 DIAGNOSIS — M25512 Pain in left shoulder: Secondary | ICD-10-CM | POA: Diagnosis not present

## 2019-08-04 MED ORDER — IOPAMIDOL (ISOVUE-M 200) INJECTION 41%
12.0000 mL | Freq: Once | INTRAMUSCULAR | Status: AC
  Administered 2019-08-04: 12 mL via INTRA_ARTICULAR

## 2019-08-08 ENCOUNTER — Ambulatory Visit (INDEPENDENT_AMBULATORY_CARE_PROVIDER_SITE_OTHER): Payer: 59 | Admitting: Orthopedic Surgery

## 2019-08-08 ENCOUNTER — Encounter: Payer: Self-pay | Admitting: Orthopedic Surgery

## 2019-08-08 DIAGNOSIS — M25512 Pain in left shoulder: Secondary | ICD-10-CM

## 2019-08-10 ENCOUNTER — Encounter: Payer: Self-pay | Admitting: Orthopedic Surgery

## 2019-08-10 NOTE — Progress Notes (Signed)
Office Visit Note   Patient: Cory Shaw           Date of Birth: 1966/05/04           MRN: [id number] Visit Date: 08/08/2019 Requested by: Vivi Barrack, MD [address] [place],  [place] [address] PCP: Vivi Barrack, MD  Subjective: Chief Complaint  Patient presents with  . Follow-up    HPI: Cory Shaw is a patient with left shoulder pain.  Since have seen him he is had an MRI scan of that left shoulder.  I thought he may have had some instability of the biceps tendon.  He states that his shoulder "popped back in" and he localizes the pain that he used to have to the anterior aspect of the bicipital groove.  MRI scan is reviewed.  Original reading demonstrated no real pathology except for [facility] joint arthritis and is not really tender in that region.  However on my review there was what appeared to be a small bony fragment just distal to the transverse humeral ligament.  I reviewed that with the radiologist who agrees.  Unclear if this is truly pathologic.  The transverse humeral ligament is intact in the region of the biceps tendon proximally and there is no subscap deficiency.  I do not think that the biceps tendon was unstable.  This fragment may have been within the bicipital groove but no donor site was identified.              ROS: All systems reviewed are negative as they relate to the chief complaint within the history of present illness.  Patient denies  fevers or chills.   Assessment & Plan: Visit Diagnoses:  1. Left shoulder pain, unspecified chronicity     Plan: Impression is left shoulder pain unclear etiology with resolution of symptoms once the shoulder "popped back in".  Hard to tell exactly what is going on with it but this may have been a loose body within the bicipital groove which has now worked itself out of the groove.  It does not look like the biceps tendon is unstable but that is also another possibility.  Rotator cuff and labrum otherwise intact.  AC joint does  have some arthritis but it is nontender and clinically asymptomatic at this time.  Plan is observation and if symptoms recur then further intervention may be indicated.  Follow-Up Instructions: Return if symptoms worsen or fail to improve.   Orders:  No orders of the defined types were placed in this encounter.  No orders of the defined types were placed in this encounter.     Procedures: No procedures performed   Clinical Data: No additional findings.  Objective: Vital Signs: There were no vitals taken for this visit.  Physical Exam:   Constitutional: Patient appears well-developed HEENT:  Head: Normocephalic Eyes:EOM are normal Neck: Normal range of motion Cardiovascular: Normal rate Pulmonary/chest: Effort normal Neurologic: Patient is alert Skin: Skin is warm Psychiatric: Patient has normal mood and affect    Ortho Exam: Ortho exam demonstrates full active and passive range of motion of that left shoulder with no discrete AC joint tenderness left versus right.  Patient has excellent infraspinatus supraspinatus and subscap muscle strength with palpable radial pulses.  No masses lymphadenopathy or skin changes noted in that shoulder girdle region.  No real tenderness in the bicipital groove.  Specialty Comments:  No specialty comments available.  Imaging: No results found.   PMFS History: Patient Active Problem List  Diagnosis Date Noted  . Dyslipidemia 06/03/2019  . Hyperglycemia 06/03/2019  . Eczema 10/25/2018  . Bilateral shoulder pain 05/21/2018  . Type 2 diabetes mellitus with hyperglycemia ([facility]) 10/15/2017  . Thrombocytopenia ([facility]) 10/15/2017  . Abnormal transaminases 10/15/2017  . Accessory bone of foot 08/27/2017  . Chronic rhinitis 07/23/2012  . Chronic respiratory failure ([facility]) 07/23/2012  . Pulmonary infiltrates 06/22/2012   Past Medical History:  Diagnosis Date  . Allergy   . Pneumonia 2010  . PONV (postoperative nausea and vomiting)     No amnesia causing drugs  . Seasonal allergies     Family History  Problem Relation Age of Onset  . Other Brother        NETS  . Diabetes Mother   . Pneumonia Mother   . Atrial fibrillation Mother   . Heart attack Maternal Grandfather     Past Surgical History:  Procedure Laterality Date  . HERNIA REPAIR    . transpositon of ulna nerve Left 2009   Social History   Occupational History  . Occupation: Stage managerT Tech for [employer]     Employer: [employer]  Tobacco Use  . Smoking status: Never Smoker  . Smokeless tobacco: Never Used  Substance and Sexual Activity  . Alcohol use: Yes    Alcohol/week: 1.0 standard drinks    Types: 1 Cans of beer per week  . Drug use: No  . Sexual activity: Yes

## 2019-10-12 IMAGING — XA FLUORO GUIDED NEEDLE PLACEMENT AND/OR ASPIRATION
1 series · 1 of 1 positions shown · non-contrast
Comparison: none

CLINICAL DATA: Left shoulder pain.

[Series 2: ortho standard · 1 of 1 slices shown]
[im 1/1]
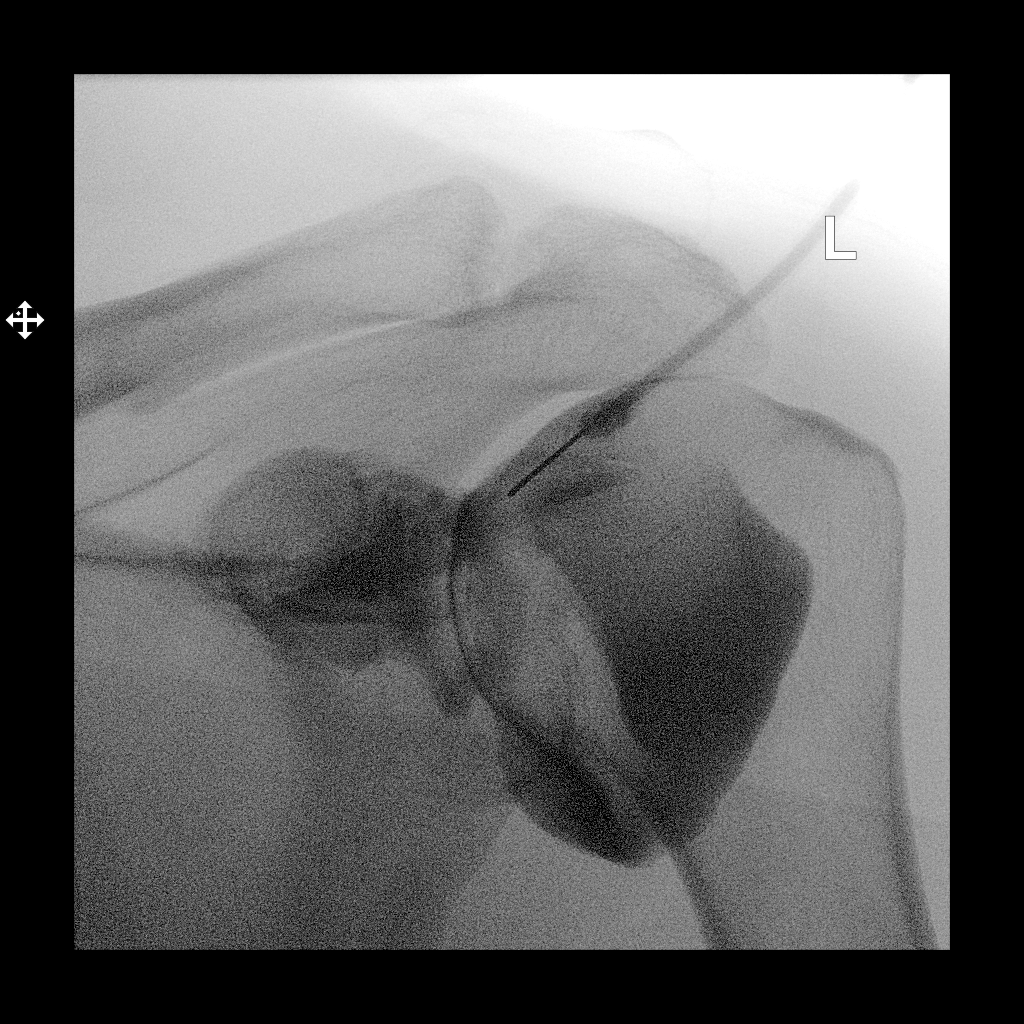

[1 of 1 positions shown; findings below may reference images not displayed]

FLUOROSCOPY TIME:  Radiation Exposure Index (as provided by the
fluoroscopic device): 0.4 mGy

Fluoroscopy Time:  1 second

Number of Acquired Images:  0

PROCEDURE:
The risks and benefits of the procedure were discussed with the
patient, and written informed consent was obtained. The patient
stated no history of allergy to contrast media. A formal timeout
procedure was performed with the patient according to departmental
protocol.

The patient was placed supine on the fluoroscopy table and the left
glenohumeral joint was identified under fluoroscopy. The skin
overlying the left glenohumeral joint was subsequently cleaned with
Betadine and a sterile drape was placed over the area of interest. 2
ml 1% Lidocaine was used to anesthetize the skin around the needle
insertion site.

A 22 gauge spinal needle was inserted into the left glenohumeral
joint under fluoroscopy.

12 ml of gadolinium mixture (0.1 ml of Multihance mixed with 10 ml
of Isovue-M 200 contrast and 10 ml of sterile saline) were injected
into the left glenohumeral joint.

The needle was removed and hemostasis was achieved. The patient was
subsequently transferred to MRI for imaging.
IMPRESSION: Technically successful left shoulder injection for MRI.

## 2020-01-02 LAB — HM DIABETES EYE EXAM

## 2020-01-14 ENCOUNTER — Encounter: Payer: Self-pay | Admitting: Family Medicine

## 2020-06-01 ENCOUNTER — Encounter: Payer: 59 | Admitting: Family Medicine

## 2020-06-05 ENCOUNTER — Encounter: Payer: 59 | Admitting: Family Medicine

## 2020-06-15 ENCOUNTER — Ambulatory Visit (INDEPENDENT_AMBULATORY_CARE_PROVIDER_SITE_OTHER): Payer: 59 | Admitting: Family Medicine

## 2020-06-15 ENCOUNTER — Other Ambulatory Visit: Payer: Self-pay

## 2020-06-15 ENCOUNTER — Encounter: Payer: Self-pay | Admitting: Family Medicine

## 2020-06-15 VITALS — BP 120/80 | HR 55 | Temp 98.4°F | Ht 69.0 in | Wt 202.4 lb

## 2020-06-15 DIAGNOSIS — Z0001 Encounter for general adult medical examination with abnormal findings: Secondary | ICD-10-CM

## 2020-06-15 DIAGNOSIS — R739 Hyperglycemia, unspecified: Secondary | ICD-10-CM | POA: Diagnosis not present

## 2020-06-15 DIAGNOSIS — Z6829 Body mass index (BMI) 29.0-29.9, adult: Secondary | ICD-10-CM | POA: Diagnosis not present

## 2020-06-15 DIAGNOSIS — E663 Overweight: Secondary | ICD-10-CM

## 2020-06-15 DIAGNOSIS — E785 Hyperlipidemia, unspecified: Secondary | ICD-10-CM | POA: Diagnosis not present

## 2020-06-15 DIAGNOSIS — J310 Chronic rhinitis: Secondary | ICD-10-CM

## 2020-06-15 LAB — COMPREHENSIVE METABOLIC PANEL
ALT: 31 U/L (ref 0–53)
AST: 24 U/L (ref 0–37)
Albumin: 4.9 g/dL (ref 3.5–5.2)
Alkaline Phosphatase: 49 U/L (ref 39–117)
BUN: 17 mg/dL (ref 6–23)
CO2: 30 mEq/L (ref 19–32)
Calcium: 9.8 mg/dL (ref 8.4–10.5)
Chloride: 99 mEq/L (ref 96–112)
Creatinine, Ser: 0.97 mg/dL (ref 0.40–1.50)
GFR: 81.16 mL/min (ref >60.00)
Glucose, Bld: 87 mg/dL (ref 70–99)
Potassium: 4.1 mEq/L (ref 3.5–5.1)
Sodium: 138 mEq/L (ref 135–145)
Total Bilirubin: 1.1 mg/dL (ref 0.2–1.2)
Total Protein: 7.4 g/dL (ref 6.0–8.3)

## 2020-06-15 LAB — LIPID PANEL
Cholesterol: 214 mg/dL — ABNORMAL HIGH (ref 0–200)
HDL: 50 mg/dL (ref >39.00)
LDL Cholesterol: 131 mg/dL — ABNORMAL HIGH (ref 0–99)
NonHDL: 163.5
Total CHOL/HDL Ratio: 4
Triglycerides: 163 mg/dL — ABNORMAL HIGH (ref 0.0–149.0)
VLDL: 32.6 mg/dL (ref 0.0–40.0)

## 2020-06-15 LAB — TSH: TSH: 1.27 u[IU]/mL (ref 0.35–4.50)

## 2020-06-15 LAB — HEMOGLOBIN A1C: Hgb A1c MFr Bld: 5.8 % (ref 4.6–6.5)

## 2020-06-15 LAB — CBC
HCT: 41.7 % (ref 39.0–52.0)
Hemoglobin: 14.4 g/dL (ref 13.0–17.0)
MCHC: 34.5 g/dL (ref 30.0–36.0)
MCV: 89.2 fl (ref 78.0–100.0)
Platelets: 132 10*3/uL — ABNORMAL LOW (ref 150.0–400.0)
RBC: 4.68 Mil/uL (ref 4.22–5.81)
RDW: 13.1 % (ref 11.5–15.5)
WBC: 5.3 10*3/uL (ref 4.0–10.5)

## 2020-06-15 NOTE — Assessment & Plan Note (Signed)
Check A1c.  Continue lifestyle modifications.  If elevated would consider trial of Jardiance or Ozempic.

## 2020-06-15 NOTE — Progress Notes (Signed)
Chief Complaint:  Cory Shaw is a 54 y.o. male who presents today for his annual comprehensive physical exam.    Assessment/Plan:  Chronic Problems Addressed Today: Chronic rhinitis Stable.  Continue Zyrtec and Flonase as needed.  Hyperglycemia Check A1c.  Continue lifestyle modifications.  If elevated would consider trial of Jardiance or Ozempic.  Dyslipidemia Check lipid panel.  Body mass index is 29.89 kg/m. / Overweight  BMI Metric Follow Up - 06/15/20 1407      BMI Metric Follow Up-Please document annually   BMI Metric Follow Up Education provided            Preventative Healthcare: Check CBC, CMET, TSH, lipid panel.  Up-to-date on colon cancer screening.  Patient Counseling(The following topics were reviewed and/or handout was given):  -Nutrition: Stressed importance of moderation in sodium/caffeine intake, saturated fat and cholesterol, caloric balance, sufficient intake of fresh fruits, vegetables, and fiber.  -Stressed the importance of regular exercise.   -Substance Abuse: Discussed cessation/primary prevention of tobacco, alcohol, or other drug use; driving or other dangerous activities under the influence; availability of treatment for abuse.   -Injury prevention: Discussed safety belts, safety helmets, smoke detector, smoking near bedding or upholstery.   -Sexuality: Discussed sexually transmitted diseases, partner selection, use of condoms, avoidance of unintended pregnancy and contraceptive alternatives.   -Dental health: Discussed importance of regular tooth brushing, flossing, and dental visits.  -Health maintenance and immunizations reviewed. Please refer to Health maintenance section.  Return to care in 1 year for next preventative visit.     Subjective:  HPI:  He has no acute complaints today.   Lifestyle Diet: Low carb.  Exercise: Trying to get more exercise. Tries to walk 30 minutes daily.   Depression screen PHQ 2/9 06/01/2019  Decreased  Interest 0  Down, Depressed, Hopeless 0  PHQ - 2 Score 0    Health Maintenance Due  Topic Date Due  . Hepatitis C Screening  Never done  . HIV Screening  Never done  . URINE MICROALBUMIN  05/31/2020     ROS: Per HPI, otherwise a complete review of systems was negative.   PMH:  The following were reviewed and entered/updated in epic: Past Medical History:  Diagnosis Date  . Allergy   . Pneumonia 2010  . PONV (postoperative nausea and vomiting)    No amnesia causing drugs  . Seasonal allergies    Patient Active Problem List   Diagnosis Date Noted  . Dyslipidemia 06/03/2019  . Hyperglycemia 06/03/2019  . Eczema 10/25/2018  . Bilateral shoulder pain 05/21/2018  . Thrombocytopenia ([facility]) 10/15/2017  . Abnormal transaminases 10/15/2017  . Accessory bone of foot 08/27/2017  . Chronic rhinitis 07/23/2012  . Chronic respiratory failure ([facility]) 07/23/2012  . Pulmonary infiltrates 06/22/2012   Past Surgical History:  Procedure Laterality Date  . HERNIA REPAIR    . transpositon of ulna nerve Left 2009    Family History  Problem Relation Age of Onset  . Other Brother        NETS  . Diabetes Mother   . Pneumonia Mother   . Atrial fibrillation Mother   . Heart attack Maternal Grandfather   . Prostate cancer Neg Hx     Medications- reviewed and updated Current Outpatient Medications  Medication Sig Dispense Refill  . cetirizine (ZYRTEC) 10 MG tablet Take 10 mg by mouth at bedtime.     . fluticasone (FLONASE) 50 MCG/ACT nasal spray Place into both nostrils daily.    Marland Kitchen ibuprofen (  ADVIL,MOTRIN) 200 MG tablet Take 400 mg by mouth at bedtime.    . Multiple Vitamin (MULTIVITAMIN) capsule Take 1 capsule by mouth daily.     No current facility-administered medications for this visit.    Allergies-reviewed and updated Allergies  Allergen Reactions  . Sulfa Antibiotics Hives    Social History   Socioeconomic History  . Marital status: Married    Spouse name: Not on file   . Number of children: Not on file  . Years of education: Not on file  . Highest education level: Not on file  Occupational History  . 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 Smoking status: Never Smoker  . Smokeless tobacco: Never Used  Vaping Use  . Vaping Use: Never used  Substance and Sexual Activity  . Alcohol use: Yes    Alcohol/week: 1.0 standard drink    Types: 1 Cans of beer per week  . Drug use: No  . Sexual activity: Yes  Other Topics Concern  . Not on file  Social History Narrative  . Not on file   Social Determinants of Health   Financial Resource Strain:   . Difficulty of Paying Living Expenses:   Food Insecurity:   . Worried About Programme researcher, broadcasting/film/video in the Last Year:   . Barista in the Last Year:   Transportation Needs:   . Freight forwarder (Medical):   Marland Kitchen Lack of Transportation (Non-Medical):   Physical Activity:   . Days of Exercise per Week:   . Minutes of Exercise per Session:   Stress:   . Feeling of Stress :   Social Connections:   . Frequency of Communication with Friends and Family:   . Frequency of Social Gatherings with Friends and Family:   . Attends Religious Services:   . Active Member of Clubs or Organizations:   . Attends Banker Meetings:   Marland Kitchen Marital Status:         Objective:  Physical Exam: BP 120/80   Pulse (!) 55   Temp 98.4 F (36.9 C) (Temporal)   Ht 5\' 9"  (1.753 m)   Wt 202 lb 6.4 oz (91.8 kg)   SpO2 98%   BMI 29.89 kg/m   Body mass index is 29.89 kg/m. Wt Readings from Last 3 Encounters:  06/15/20 202 lb 6.4 oz (91.8 kg)  06/01/19 205 lb 12 oz (93.3 kg)  11/18/18 208 lb 3.2 oz (94.4 kg)   Gen: NAD, resting comfortably HEENT: TMs normal bilaterally. OP clear. No thyromegaly noted.  CV: RRR with no murmurs appreciated Pulm: NWOB, CTAB with no crackles, wheezes, or rhonchi GI: Normal bowel sounds present. Soft, Nontender, Nondistended. MSK: no  edema, cyanosis, or clubbing noted Skin: warm, dry Neuro: CN2-12 grossly intact. Strength 5/5 in upper and lower extremities. Reflexes symmetric and intact bilaterally.  Psych: Normal affect and thought content     Cory Shaw M. 14/12/19, MD 06/15/2020 2:09 PM

## 2020-06-15 NOTE — Patient Instructions (Signed)
It was very nice to see you today!  Keep up the good work!  We will check blood work today.  I will see you back in year for your next checkup with blood work.  Please come back to see me sooner if needed.  Take care, Dr Jerline Pain  Please try these tips to maintain a healthy lifestyle:   Eat at least 3 REAL meals and 1-2 snacks per day.  Aim for no more than 5 hours between eating.  If you eat breakfast, please do so within one hour of getting up.    Each meal should contain half fruits/vegetables, one quarter protein, and one quarter carbs (no bigger than a computer mouse)   Cut down on sweet beverages. This includes juice, soda, and sweet tea.     Drink at least 1 glass of water with each meal and aim for at least 8 glasses per day   Exercise at least 150 minutes every week.    Preventive Care 52-18 Years Old, Male Preventive care refers to lifestyle choices and visits with your health care provider that can promote health and wellness. This includes:  A yearly physical exam. This is also called an annual well check.  Regular dental and eye exams.  Immunizations.  Screening for certain conditions.  Healthy lifestyle choices, such as eating a healthy diet, getting regular exercise, not using drugs or products that contain nicotine and tobacco, and limiting alcohol use. What can I expect for my preventive care visit? Physical exam Your health care provider will check:  Height and weight. These may be used to calculate body mass index (BMI), which is a measurement that tells if you are at a healthy weight.  Heart rate and blood pressure.  Your skin for abnormal spots. Counseling Your health care provider may ask you questions about:  Alcohol, tobacco, and drug use.  Emotional well-being.  Home and relationship well-being.  Sexual activity.  Eating habits.  Work and work Statistician. What immunizations do I need?  Influenza (flu) vaccine  This is  recommended every year. Tetanus, diphtheria, and pertussis (Tdap) vaccine  You may need a Td booster every 10 years. Varicella (chickenpox) vaccine  You may need this vaccine if you have not already been vaccinated. Zoster (shingles) vaccine  You may need this after age 52. Measles, mumps, and rubella (MMR) vaccine  You may need at least one dose of MMR if you were born in 1957 or later. You may also need a second dose. Pneumococcal conjugate (PCV13) vaccine  You may need this if you have certain conditions and were not previously vaccinated. Pneumococcal polysaccharide (PPSV23) vaccine  You may need one or two doses if you smoke cigarettes or if you have certain conditions. Meningococcal conjugate (MenACWY) vaccine  You may need this if you have certain conditions. Hepatitis A vaccine  You may need this if you have certain conditions or if you travel or work in places where you may be exposed to hepatitis A. Hepatitis B vaccine  You may need this if you have certain conditions or if you travel or work in places where you may be exposed to hepatitis B. Haemophilus influenzae type b (Hib) vaccine  You may need this if you have certain risk factors. Human papillomavirus (HPV) vaccine  If recommended by your health care provider, you may need three doses over 6 months. You may receive vaccines as individual doses or as more than one vaccine together in one shot (combination vaccines). Talk  with your health care provider about the risks and benefits of combination vaccines. What tests do I need? Blood tests  Lipid and cholesterol levels. These may be checked every 5 years, or more frequently if you are over 52 years old.  Hepatitis C test.  Hepatitis B test. Screening  Lung cancer screening. You may have this screening every year starting at age 52 if you have a 30-pack-year history of smoking and currently smoke or have quit within the past 15 years.  Prostate cancer  screening. Recommendations will vary depending on your family history and other risks.  Colorectal cancer screening. All adults should have this screening starting at age 52 and continuing until age 52. Your health care provider may recommend screening at age 52 if you are at increased risk. You will have tests every 1-10 years, depending on your results and the type of screening test.  Diabetes screening. This is done by checking your blood sugar (glucose) after you have not eaten for a while (fasting). You may have this done every 1-3 years.  Sexually transmitted disease (STD) testing. Follow these instructions at home: Eating and drinking  Eat a diet that includes fresh fruits and vegetables, whole grains, lean protein, and low-fat dairy products.  Take vitamin and mineral supplements as recommended by your health care provider.  Do not drink alcohol if your health care provider tells you not to drink.  If you drink alcohol: ? Limit how much you have to 0-2 drinks a day. ? Be aware of how much alcohol is in your drink. In the [place] one drink equals one 12 oz bottle of beer (355 mL), one 5 oz glass of wine (148 mL), or one 1 oz glass of hard liquor (44 mL). Lifestyle  Take daily care of your teeth and gums.  Stay active. Exercise for at least 30 minutes on 5 or more days each week.  Do not use any products that contain nicotine or tobacco, such as cigarettes, e-cigarettes, and chewing tobacco. If you need help quitting, ask your health care provider.  If you are sexually active, practice safe sex. Use a condom or other form of protection to prevent STIs (sexually transmitted infections).  Talk with your health care provider about taking a low-dose aspirin every day starting at age 52. What's next?  Go to your health care provider once a year for a well check visit.  Ask your health care provider how often you should have your eyes and teeth checked.  Stay up to date on all  vaccines. This information is not intended to replace advice given to you by your health care provider. Make sure you discuss any questions you have with your health care provider. Document Revised: 11/18/2018 Document Reviewed: 11/18/2018 Elsevier Patient Education  2020 [employer].

## 2020-06-15 NOTE — Assessment & Plan Note (Signed)
Check lipid panel  

## 2020-06-15 NOTE — Assessment & Plan Note (Signed)
Stable.  Continue Zyrtec and Flonase as needed.

## 2020-06-18 NOTE — Progress Notes (Signed)
Please inform patient of the following:  Cholesterol numbers borderline but better than last year. Blood sugar stable. Everything else is NORMAL. Do not need to make any adjustments to his treatment plan at this time. Recommend that he continue to work on diet and exercise and we can recheck in a year or so.  Cory Shaw. Jimmey Ralph, MD 06/18/2020 8:37 AM

## 2020-06-19 ENCOUNTER — Telehealth: Payer: Self-pay | Admitting: Family Medicine

## 2020-06-19 NOTE — Telephone Encounter (Signed)
Patient is returning Cory Shaw's call about lab results.

## 2020-06-19 NOTE — Telephone Encounter (Signed)
Patient notified of lab results

## 2020-09-18 ENCOUNTER — Encounter: Payer: Self-pay | Admitting: Family Medicine

## 2020-10-08 ENCOUNTER — Encounter: Payer: Self-pay | Admitting: Family Medicine

## 2020-12-17 LAB — HM DIABETES EYE EXAM

## 2021-01-11 ENCOUNTER — Encounter: Payer: Self-pay | Admitting: Family Medicine

## 2021-07-15 ENCOUNTER — Other Ambulatory Visit (HOSPITAL_BASED_OUTPATIENT_CLINIC_OR_DEPARTMENT_OTHER): Payer: Self-pay

## 2021-07-15 ENCOUNTER — Other Ambulatory Visit: Payer: Self-pay

## 2021-07-15 ENCOUNTER — Ambulatory Visit: Payer: 59 | Attending: Internal Medicine

## 2021-07-15 DIAGNOSIS — Z23 Encounter for immunization: Secondary | ICD-10-CM

## 2021-07-15 MED ORDER — PFIZER-BIONT COVID-19 VAC-TRIS 30 MCG/0.3ML IM SUSP
INTRAMUSCULAR | 0 refills | Status: DC
  Filled 2021-07-15: qty 0.3, 1d supply, fill #0

## 2021-07-15 NOTE — Progress Notes (Signed)
   Covid-19 Vaccination Clinic  Name:  Cory Shaw    MRN: [id number] DOB: September 26, 1966  07/15/2021  Mr. Desroches was observed post Covid-19 immunization for 15 minutes without incident. He was provided with Vaccine Information Sheet and instruction to access the V-Safe system.   Mr. Hollar was instructed to call 911 with any severe reactions post vaccine: Difficulty breathing  Swelling of face and throat  A fast heartbeat  A bad rash all over body  Dizziness and weakness   Immunizations Administered     Name Date Dose VIS Date Route   PFIZER Comrnaty(Gray TOP) Covid-19 Vaccine 07/15/2021  2:10 PM 0.3 mL 11/15/2020 Intramuscular   Manufacturer: [employer], [employer]   Lot: [id number]   NDC: [phone]

## 2021-09-17 DIAGNOSIS — M9901 Segmental and somatic dysfunction of cervical region: Secondary | ICD-10-CM | POA: Diagnosis not present

## 2021-09-17 DIAGNOSIS — M6240 Contracture of muscle, unspecified site: Secondary | ICD-10-CM | POA: Diagnosis not present

## 2021-09-17 DIAGNOSIS — M9902 Segmental and somatic dysfunction of thoracic region: Secondary | ICD-10-CM | POA: Diagnosis not present

## 2021-09-18 ENCOUNTER — Other Ambulatory Visit (HOSPITAL_BASED_OUTPATIENT_CLINIC_OR_DEPARTMENT_OTHER): Payer: Self-pay

## 2021-09-18 ENCOUNTER — Ambulatory Visit (INDEPENDENT_AMBULATORY_CARE_PROVIDER_SITE_OTHER): Payer: 59 | Admitting: Family Medicine

## 2021-09-18 VITALS — BP 130/86 | Ht 69.0 in | Wt 202.0 lb

## 2021-09-18 DIAGNOSIS — M9901 Segmental and somatic dysfunction of cervical region: Secondary | ICD-10-CM | POA: Diagnosis not present

## 2021-09-18 DIAGNOSIS — M6240 Contracture of muscle, unspecified site: Secondary | ICD-10-CM | POA: Diagnosis not present

## 2021-09-18 DIAGNOSIS — M9902 Segmental and somatic dysfunction of thoracic region: Secondary | ICD-10-CM | POA: Diagnosis not present

## 2021-09-18 DIAGNOSIS — M7712 Lateral epicondylitis, left elbow: Secondary | ICD-10-CM | POA: Diagnosis not present

## 2021-09-18 MED ORDER — NITROGLYCERIN 0.2 MG/HR TD PT24
MEDICATED_PATCH | TRANSDERMAL | 1 refills | Status: DC
  Filled 2021-09-18: qty 7, 30d supply, fill #0
  Filled 2021-10-18: qty 7, 30d supply, fill #1
  Filled 2021-11-25: qty 7, 30d supply, fill #2

## 2021-09-18 NOTE — Patient Instructions (Signed)
You have lateral epicondylitis Try to avoid painful activities as much as possible. Ice the area 3-4 times a day for 15 minutes at a time if needed. Tylenol or aleve as needed for pain but these won't make you better faster. Counterforce brace as directed can help unload area - wear this regularly if it provides you with relief. Do home exercises and stretches as directed once a day. Nitro patches 1/4th patch to affected area, change daily. Consider physical therapy. Injection is only considered for severe pain - studies show those who get the injection are actually more likely to have problems still a year out from their initial pain Follow up in 6 weeks.

## 2021-09-18 NOTE — Progress Notes (Signed)
PCP: Ardith Dark, MD  Subjective:   HPI: Patient is a 55 y.o. male here for left elbow pain.  Patient reports about 3 months ago he was working out with increased weights. No acute injury but developed lateral left elbow pain following this. No swelling or bruising. He is left handed. Since then has had pain using left hand, gripping items. Has rested and this has improved some but has not gone away. No numbness.  Past Medical History:  Diagnosis Date   Allergy    Pneumonia 2010   PONV (postoperative nausea and vomiting)    No amnesia causing drugs   Seasonal allergies     Current Outpatient Medications on File Prior to Visit  Medication Sig Dispense Refill   cetirizine (ZYRTEC) 10 MG tablet Take 10 mg by mouth at bedtime.      COVID-19 mRNA Vac-TriS, Pfizer, (PFIZER-BIONT COVID-19 VAC-TRIS) SUSP injection Inject into the muscle. 0.3 mL 0   fluticasone (FLONASE) 50 MCG/ACT nasal spray Place into both nostrils daily.     ibuprofen (ADVIL,MOTRIN) 200 MG tablet Take 400 mg by mouth at bedtime.     Multiple Vitamin (MULTIVITAMIN) capsule Take 1 capsule by mouth daily.     No current facility-administered medications on file prior to visit.    Past Surgical History:  Procedure Laterality Date   HERNIA REPAIR     transpositon of ulna nerve Left 2009    Allergies  Allergen Reactions   Sulfa Antibiotics Hives    BP 130/86   Ht 5\' 9"  (1.753 m)   Wt 202 lb (91.6 kg)   BMI 29.83 kg/m   [facility] 09/18/2021  Frequency of aerobic exercise (# of days/week) 3  Average time in minutes > 90  Frequency of strengthening activities (# of days/week) 2    No flowsheet data found.      Objective:  Physical Exam:  Gen: NAD, comfortable in exam room  Left elbow: No deformity, swelling, bruising. FROM with 5/5 strength.  Pain with wrist extension and 3rd digit extension Tenderness to palpation lateral epicondyle.  No other tenderness. NVI  distally. Collateral ligaments intact.   Assessment & Plan:  1. Left lateral epicondylitis - shown home exercises and stretches to do daily.  Counterforce brace, nitro patches.  Tylenol or aleve only if needed for pain.  Consider physical therapy if not improving.  F/u in 6 weeks.

## 2021-09-24 DIAGNOSIS — M9902 Segmental and somatic dysfunction of thoracic region: Secondary | ICD-10-CM | POA: Diagnosis not present

## 2021-09-24 DIAGNOSIS — M9901 Segmental and somatic dysfunction of cervical region: Secondary | ICD-10-CM | POA: Diagnosis not present

## 2021-09-24 DIAGNOSIS — M6240 Contracture of muscle, unspecified site: Secondary | ICD-10-CM | POA: Diagnosis not present

## 2021-09-26 DIAGNOSIS — M9901 Segmental and somatic dysfunction of cervical region: Secondary | ICD-10-CM | POA: Diagnosis not present

## 2021-09-26 DIAGNOSIS — M6240 Contracture of muscle, unspecified site: Secondary | ICD-10-CM | POA: Diagnosis not present

## 2021-09-26 DIAGNOSIS — M9902 Segmental and somatic dysfunction of thoracic region: Secondary | ICD-10-CM | POA: Diagnosis not present

## 2021-10-08 DIAGNOSIS — M9902 Segmental and somatic dysfunction of thoracic region: Secondary | ICD-10-CM | POA: Diagnosis not present

## 2021-10-08 DIAGNOSIS — M9901 Segmental and somatic dysfunction of cervical region: Secondary | ICD-10-CM | POA: Diagnosis not present

## 2021-10-08 DIAGNOSIS — M6240 Contracture of muscle, unspecified site: Secondary | ICD-10-CM | POA: Diagnosis not present

## 2021-10-18 ENCOUNTER — Other Ambulatory Visit (HOSPITAL_BASED_OUTPATIENT_CLINIC_OR_DEPARTMENT_OTHER): Payer: Self-pay

## 2021-10-30 ENCOUNTER — Ambulatory Visit: Payer: 59 | Attending: Internal Medicine

## 2021-10-30 ENCOUNTER — Other Ambulatory Visit (HOSPITAL_BASED_OUTPATIENT_CLINIC_OR_DEPARTMENT_OTHER): Payer: Self-pay

## 2021-10-30 ENCOUNTER — Ambulatory Visit (INDEPENDENT_AMBULATORY_CARE_PROVIDER_SITE_OTHER): Payer: 59 | Admitting: Family Medicine

## 2021-10-30 ENCOUNTER — Encounter: Payer: Self-pay | Admitting: Family Medicine

## 2021-10-30 VITALS — BP 138/90 | Ht 69.0 in | Wt 202.0 lb

## 2021-10-30 DIAGNOSIS — M7712 Lateral epicondylitis, left elbow: Secondary | ICD-10-CM

## 2021-10-30 DIAGNOSIS — Z23 Encounter for immunization: Secondary | ICD-10-CM

## 2021-10-30 MED ORDER — PFIZER COVID-19 VAC BIVALENT 30 MCG/0.3ML IM SUSP
INTRAMUSCULAR | 0 refills | Status: DC
  Filled 2021-10-30: qty 0.3, 1d supply, fill #0

## 2021-10-30 NOTE — Progress Notes (Signed)
PCP: Ardith Dark, MD  Subjective:   HPI: Patient is a 55 y.o. male here for left elbow pain.  10/12: Patient reports about 3 months ago he was working out with increased weights. No acute injury but developed lateral left elbow pain following this. No swelling or bruising. He is left handed. Since then has had pain using left hand, gripping items. Has rested and this has improved some but has not gone away. No numbness.  11/23: Patient reports he's doing some better compared to last visit. Able to pick up more items with less pain. Doing home exercises and stretches. Still bothers lifting heavy items and can wake him up at night with immobility. No numbness.  Past Medical History:  Diagnosis Date   Allergy    Pneumonia 2010   PONV (postoperative nausea and vomiting)    No amnesia causing drugs   Seasonal allergies     Current Outpatient Medications on File Prior to Visit  Medication Sig Dispense Refill   cetirizine (ZYRTEC) 10 MG tablet Take 10 mg by mouth at bedtime.      COVID-19 mRNA Vac-TriS, Pfizer, (PFIZER-BIONT COVID-19 VAC-TRIS) SUSP injection Inject into the muscle. 0.3 mL 0   fluticasone (FLONASE) 50 MCG/ACT nasal spray Place into both nostrils daily.     ibuprofen (ADVIL,MOTRIN) 200 MG tablet Take 400 mg by mouth at bedtime.     Multiple Vitamin (MULTIVITAMIN) capsule Take 1 capsule by mouth daily.     nitroGLYCERIN (NITRODUR - DOSED IN MG/24 HR) 0.2 mg/hr patch Apply 1/4 patch to affected elbow, change daily. 30 patch 1   No current facility-administered medications on file prior to visit.    Past Surgical History:  Procedure Laterality Date   HERNIA REPAIR     transpositon of ulna nerve Left 2009    Allergies  Allergen Reactions   Sulfa Antibiotics Hives    BP 138/90   Ht 5\' 9"  (1.753 m)   Wt 202 lb (91.6 kg)   BMI 29.83 kg/m   [facility] 09/18/2021  Frequency of aerobic exercise (# of days/week) 3  Average  time in minutes > 90  Frequency of strengthening activities (# of days/week) 2    No flowsheet data found.      Objective:  Physical Exam:  Gen: NAD, comfortable in exam room  Left elbow: No deformity, swelling, bruising. FROM with 5/5 strength - no pain resisted wrist extension and 3rd digit extension No tenderness to palpation. NVI distally.   Assessment & Plan:  1. Left lateral epicondylitis - clinically improved.  Continue home exercises, nitro patches, stretches.  F/u in 6 weeks unless improved.

## 2021-10-30 NOTE — Progress Notes (Signed)
   Covid-19 Vaccination Clinic  Name:  Cory Shaw    MRN: [id number] DOB: 1966-03-11  10/30/2021  [name] was observed post Covid-19 immunization for 15 minutes without incident. He was provided with Vaccine Information Sheet and instruction to access the V-Safe system.   [name] was instructed to call 911 with any severe reactions post vaccine: Difficulty breathing  Swelling of face and throat  A fast heartbeat  A bad rash all over body  Dizziness and weakness   Immunizations Administered     Name Date Dose VIS Date Route   Pfizer Covid-19 Vaccine Bivalent Booster 10/30/2021 11:48 AM 0.3 mL 08/07/2021 Intramuscular   Manufacturer: [employer], [employer]   Lot: [id number]   NDC: [id number]

## 2021-10-30 NOTE — Patient Instructions (Signed)
Continue the exercises, stretches, and patches for at least 6 more weeks. Follow up with me at that time unless you're significantly better. If you are better at that time you can stop the patches and do the exercises/stretches for a few more weeks.

## 2021-11-26 ENCOUNTER — Other Ambulatory Visit (HOSPITAL_BASED_OUTPATIENT_CLINIC_OR_DEPARTMENT_OTHER): Payer: Self-pay

## 2021-12-11 ENCOUNTER — Ambulatory Visit (INDEPENDENT_AMBULATORY_CARE_PROVIDER_SITE_OTHER): Payer: 59 | Admitting: Family Medicine

## 2021-12-11 ENCOUNTER — Encounter: Payer: Self-pay | Admitting: Family Medicine

## 2021-12-11 VITALS — BP 132/90 | Ht 69.0 in | Wt 198.0 lb

## 2021-12-11 DIAGNOSIS — M7712 Lateral epicondylitis, left elbow: Secondary | ICD-10-CM

## 2021-12-11 NOTE — Progress Notes (Signed)
PCP: Ardith Dark, MD  Subjective:   HPI: Patient is a 56 y.o. male here for left elbow pain.  10/12: Patient reports about 3 months ago he was working out with increased weights. No acute injury but developed lateral left elbow pain following this. No swelling or bruising. He is left handed. Since then has had pain using left hand, gripping items. Has rested and this has improved some but has not gone away. No numbness.  11/23: Patient reports he's doing some better compared to last visit. Able to pick up more items with less pain. Doing home exercises and stretches. Still bothers lifting heavy items and can wake him up at night with immobility. No numbness.  12/11/21: Patient reports he's doing well.  No pain at nighttime now, rarely feels it now with lifting heavy items. Doing home exercises and stretches. Stopped nitro about a week ago - getting headaches with these.  Past Medical History:  Diagnosis Date   Allergy    Pneumonia 2010   PONV (postoperative nausea and vomiting)    No amnesia causing drugs   Seasonal allergies     Current Outpatient Medications on File Prior to Visit  Medication Sig Dispense Refill   cetirizine (ZYRTEC) 10 MG tablet Take 10 mg by mouth at bedtime.      COVID-19 mRNA bivalent vaccine, Pfizer, (PFIZER COVID-19 VAC BIVALENT) injection Inject into the muscle. 0.3 mL 0   COVID-19 mRNA Vac-TriS, Pfizer, (PFIZER-BIONT COVID-19 VAC-TRIS) SUSP injection Inject into the muscle. 0.3 mL 0   fluticasone (FLONASE) 50 MCG/ACT nasal spray Place into both nostrils daily.     ibuprofen (ADVIL,MOTRIN) 200 MG tablet Take 400 mg by mouth at bedtime.     Multiple Vitamin (MULTIVITAMIN) capsule Take 1 capsule by mouth daily.     nitroGLYCERIN (NITRODUR - DOSED IN MG/24 HR) 0.2 mg/hr patch Apply 1/4 patch to affected elbow, change daily. 30 patch 1   No current facility-administered medications on file prior to visit.    Past Surgical History:  Procedure  Laterality Date   HERNIA REPAIR     transpositon of ulna nerve Left 2009    Allergies  Allergen Reactions   Sulfa Antibiotics Hives    BP 132/90    Ht 5\' 9"  (1.753 m)    Wt 198 lb (89.8 kg)    BMI 29.24 kg/m   [facility] 09/18/2021 12/11/2021  Frequency of aerobic exercise (# of days/week) 3 4  Average time in minutes > 90 45  Frequency of strengthening activities (# of days/week) 2 4    No flowsheet data found.      Objective:  Physical Exam:  Gen: NAD, comfortable in exam room  Left elbow: No deformity. FROM with 5/5 strength.  No pain resisted wrist and 3rd digit extension. No tenderness to palpation. NVI distally.   Assessment & Plan:  1. Left lateral epicondylitis - clinically improved.  Discontinue home exercises, stretches.  Headaches recently with nitro so stopped these as well.  F/u prn.

## 2021-12-30 LAB — HM DIABETES EYE EXAM

## 2022-01-09 ENCOUNTER — Encounter: Payer: Self-pay | Admitting: Family Medicine

## 2022-07-15 ENCOUNTER — Encounter: Payer: Self-pay | Admitting: Family Medicine

## 2022-07-15 ENCOUNTER — Ambulatory Visit (INDEPENDENT_AMBULATORY_CARE_PROVIDER_SITE_OTHER): Payer: 59 | Admitting: Family Medicine

## 2022-07-15 VITALS — BP 131/75 | HR 51 | Temp 97.8°F | Ht 69.0 in | Wt 207.8 lb

## 2022-07-15 DIAGNOSIS — E785 Hyperlipidemia, unspecified: Secondary | ICD-10-CM | POA: Diagnosis not present

## 2022-07-15 DIAGNOSIS — Z23 Encounter for immunization: Secondary | ICD-10-CM

## 2022-07-15 DIAGNOSIS — Z1211 Encounter for screening for malignant neoplasm of colon: Secondary | ICD-10-CM

## 2022-07-15 DIAGNOSIS — Z1159 Encounter for screening for other viral diseases: Secondary | ICD-10-CM

## 2022-07-15 DIAGNOSIS — J9610 Chronic respiratory failure, unspecified whether with hypoxia or hypercapnia: Secondary | ICD-10-CM | POA: Diagnosis not present

## 2022-07-15 DIAGNOSIS — Z114 Encounter for screening for human immunodeficiency virus [HIV]: Secondary | ICD-10-CM

## 2022-07-15 DIAGNOSIS — Z0001 Encounter for general adult medical examination with abnormal findings: Secondary | ICD-10-CM | POA: Diagnosis not present

## 2022-07-15 DIAGNOSIS — R739 Hyperglycemia, unspecified: Secondary | ICD-10-CM | POA: Diagnosis not present

## 2022-07-15 DIAGNOSIS — D696 Thrombocytopenia, unspecified: Secondary | ICD-10-CM | POA: Diagnosis not present

## 2022-07-15 LAB — TSH: TSH: 1.5 u[IU]/mL (ref 0.35–5.50)

## 2022-07-15 LAB — COMPREHENSIVE METABOLIC PANEL
ALT: 53 U/L (ref 0–53)
AST: 32 U/L (ref 0–37)
Albumin: 4.9 g/dL (ref 3.5–5.2)
Alkaline Phosphatase: 45 U/L (ref 39–117)
BUN: 24 mg/dL — ABNORMAL HIGH (ref 6–23)
CO2: 29 mEq/L (ref 19–32)
Calcium: 10 mg/dL (ref 8.4–10.5)
Chloride: 101 mEq/L (ref 96–112)
Creatinine, Ser: 1.03 mg/dL (ref 0.40–1.50)
GFR: 82.38 mL/min (ref >60.00)
Glucose, Bld: 115 mg/dL — ABNORMAL HIGH (ref 70–99)
Potassium: 5.3 mEq/L — ABNORMAL HIGH (ref 3.5–5.1)
Sodium: 140 mEq/L (ref 135–145)
Total Bilirubin: 0.9 mg/dL (ref 0.2–1.2)
Total Protein: 7.7 g/dL (ref 6.0–8.3)

## 2022-07-15 LAB — LIPID PANEL
Cholesterol: 236 mg/dL — ABNORMAL HIGH (ref 0–200)
HDL: 51.2 mg/dL (ref >39.00)
NonHDL: 184.58
Total CHOL/HDL Ratio: 5
Triglycerides: 234 mg/dL — ABNORMAL HIGH (ref 0.0–149.0)
VLDL: 46.8 mg/dL — ABNORMAL HIGH (ref 0.0–40.0)

## 2022-07-15 LAB — CBC
HCT: 42.1 % (ref 39.0–52.0)
Hemoglobin: 14.3 g/dL (ref 13.0–17.0)
MCHC: 34 g/dL (ref 30.0–36.0)
MCV: 91.1 fl (ref 78.0–100.0)
Platelets: 130 10*3/uL — ABNORMAL LOW (ref 150.0–400.0)
RBC: 4.62 Mil/uL (ref 4.22–5.81)
RDW: 13.6 % (ref 11.5–15.5)
WBC: 3.7 10*3/uL — ABNORMAL LOW (ref 4.0–10.5)

## 2022-07-15 LAB — HEMOGLOBIN A1C: Hgb A1c MFr Bld: 6.2 % (ref 4.6–6.5)

## 2022-07-15 LAB — LDL CHOLESTEROL, DIRECT: Direct LDL: 133 mg/dL

## 2022-07-15 NOTE — Addendum Note (Signed)
Addended by: Dyann Kief on: 07/15/2022 09:26 AM   Modules accepted: Orders

## 2022-07-15 NOTE — Assessment & Plan Note (Signed)
Check CBC 

## 2022-07-15 NOTE — Patient Instructions (Signed)
It was very nice to see you today!  We will give you your shingles vaccine today.  We will check blood work.  We will order your cologuard.  I will see back in year for your next physical.  Come back sooner if needed.  Take care, Dr Jimmey Ralph  PLEASE NOTE:  If you had any lab tests please let us know if you have not heard back within a few days. You may see your results on mychart before we have a chance to review them but we will give you a call once they are reviewed by [place]. If we ordered any referrals today, please let us know if you have not heard from their office within the next week.   Please try these tips to maintain a healthy lifestyle:  Eat at least 3 REAL meals and 1-2 snacks per day.  Aim for no more than 5 hours between eating.  If you eat breakfast, please do so within one hour of getting up.   Each meal should contain half fruits/vegetables, one quarter protein, and one quarter carbs (no bigger than a computer mouse)  Cut down on sweet beverages. This includes juice, soda, and sweet tea.   Drink at least 1 glass of water with each meal and aim for at least 8 glasses per day  Exercise at least 150 minutes every week.

## 2022-07-15 NOTE — Assessment & Plan Note (Signed)
Stable. Satting 97% on room air.

## 2022-07-15 NOTE — Assessment & Plan Note (Signed)
Check lipids. Discussed lifestyle modifications.  

## 2022-07-15 NOTE — Assessment & Plan Note (Signed)
Check A1c. 

## 2022-07-15 NOTE — Progress Notes (Signed)
Chief Complaint:  Cory Shaw is a 56 y.o. male who presents today for his annual comprehensive physical exam.    Assessment/Plan:  Chronic Problems Addressed Today: Chronic respiratory failure (HCC) Stable. Satting 97% on room air.   Hyperglycemia Check A1c.   Dyslipidemia Check lipids.  Discussed lifestyle modifications.  Thrombocytopenia (HCC) Check CBC.   Preventative Healthcare: Lower cologuard.  Shingles vaccine given today.  Check labs.  Patient Counseling(The following topics were reviewed and/or handout was given):  -Nutrition: Stressed importance of moderation in sodium/caffeine intake, saturated fat and cholesterol, caloric balance, sufficient intake of fresh fruits, vegetables, and fiber.  -Stressed the importance of regular exercise.   -Substance Abuse: Discussed cessation/primary prevention of tobacco, alcohol, or other drug use; driving or other dangerous activities under the influence; availability of treatment for abuse.   -Injury prevention: Discussed safety belts, safety helmets, smoke detector, smoking near bedding or upholstery.   -Sexuality: Discussed sexually transmitted diseases, partner selection, use of condoms, avoidance of unintended pregnancy and contraceptive alternatives.   -Dental health: Discussed importance of regular tooth brushing, flossing, and dental visits.  -Health maintenance and immunizations reviewed. Please refer to Health maintenance section.  Return to care in 1 year for next preventative visit.     Subjective:  HPI:  He has no acute complaints today.   Lifestyle Diet: Low carb.  Exercise: Box three times per week. Weight training twice per week.      07/15/2022    8:47 AM  Depression screen PHQ 2/9  Decreased Interest 0  Down, Depressed, Hopeless 0  PHQ - 2 Score 0    Health Maintenance Due  Topic Date Due   Hepatitis C Screening  Never done   Zoster Vaccines- Shingrix (1 of 2) Never done   Fecal DNA (Cologuard)   06/05/2022     ROS: Per HPI, otherwise a complete review of systems was negative.   PMH:  The following were reviewed and entered/updated in epic: Past Medical History:  Diagnosis Date   Allergy    Pneumonia 2010   PONV (postoperative nausea and vomiting)    No amnesia causing drugs   Seasonal allergies    Patient Active Problem List   Diagnosis Date Noted   Dyslipidemia 06/03/2019   Hyperglycemia 06/03/2019   Eczema 10/25/2018   Bilateral shoulder pain 05/21/2018   Thrombocytopenia (HCC) 10/15/2017   Abnormal transaminases 10/15/2017   Accessory bone of foot 08/27/2017   Chronic rhinitis 07/23/2012   Chronic respiratory failure (HCC) 07/23/2012   Pulmonary infiltrates 06/22/2012   Past Surgical History:  Procedure Laterality Date   HERNIA REPAIR     transpositon of ulna nerve Left 2009    Family History  Problem Relation Age of Onset   Other Brother        NETS   Diabetes Mother    Pneumonia Mother    Atrial fibrillation Mother    Heart attack Maternal Grandfather    Prostate cancer Neg Hx     Medications- reviewed and updated Current Outpatient Medications  Medication Sig Dispense Refill   cetirizine (ZYRTEC) 10 MG tablet Take 10 mg by mouth at bedtime.      COVID-19 mRNA bivalent vaccine, Pfizer, (PFIZER COVID-19 VAC BIVALENT) injection Inject into the muscle. 0.3 mL 0   COVID-19 mRNA Vac-TriS, Pfizer, (PFIZER-BIONT COVID-19 VAC-TRIS) SUSP injection Inject into the muscle. 0.3 mL 0   fluticasone (FLONASE) 50 MCG/ACT nasal spray Place into both nostrils daily.     ibuprofen (ADVIL,MOTRIN) 200  MG tablet Take 400 mg by mouth at bedtime.     Multiple Vitamin (MULTIVITAMIN) capsule Take 1 capsule by mouth daily.     No current facility-administered medications for this visit.    Allergies-reviewed and updated Allergies  Allergen Reactions   Sulfa Antibiotics Hives    Social History   Socioeconomic History   Marital status: Married    Spouse name:  Not on file   Number of children: Not on file   Years of education: Not on file   Highest education level: Not on file  Occupational History   Occupation: Stage manager for [employer]     Employer: [employer]  Tobacco Use   Smoking status: Never   Smokeless tobacco: Never  Vaping Use   Vaping Use: Never used  Substance and Sexual Activity   Alcohol use: Yes    Alcohol/week: 1.0 standard drink of alcohol    Types: 1 Cans of beer per week   Drug use: No   Sexual activity: Yes  Other Topics Concern   Not on file  Social History Narrative   Not on file   Social Determinants of Health   Financial Resource Strain: Not on file  Food Insecurity: Not on file  Transportation Needs: Not on file  Physical Activity: Not on file  Stress: Not on file  Social Connections: Not on file        Objective:  Physical Exam: BP 131/75   Pulse (!) 51   Temp 97.8 F (36.6 C) (Oral)   Ht 5\' 9"  (1.753 m)   Wt 207 lb 12.8 oz (94.3 kg)   SpO2 97%   BMI 30.69 kg/m   Body mass index is 30.69 kg/m. Wt Readings from Last 3 Encounters:  07/15/22 207 lb 12.8 oz (94.3 kg)  12/11/21 198 lb (89.8 kg)  10/30/21 202 lb (91.6 kg)   Gen: NAD, resting comfortably HEENT: TMs normal bilaterally. OP clear. No thyromegaly noted.  CV: RRR with no murmurs appreciated Pulm: NWOB, CTAB with no crackles, wheezes, or rhonchi GI: Normal bowel sounds present. Soft, Nontender, Nondistended. MSK: no edema, cyanosis, or clubbing noted Skin: warm, dry Neuro: CN2-12 grossly intact. Strength 5/5 in upper and lower extremities. Reflexes symmetric and intact bilaterally.  Psych: Normal affect and thought content     Thelbert Gartin M. 11/01/21, MD 07/15/2022 9:23 AM

## 2022-07-16 LAB — HEPATITIS C ANTIBODY: Hepatitis C Ab: NONREACTIVE

## 2022-07-16 LAB — HIV ANTIBODY (ROUTINE TESTING W REFLEX): HIV 1&2 Ab, 4th Generation: NONREACTIVE

## 2022-07-21 NOTE — Progress Notes (Signed)
Please inform patient of the following:  Cholesterol and blood sugar levels are elevated.  Do not need to start any meds but he should continue to work on diet and exercise and we can recheck this in a year.  Thing else is stable.

## 2022-07-22 ENCOUNTER — Telehealth: Payer: Self-pay | Admitting: Family Medicine

## 2022-07-22 NOTE — Telephone Encounter (Signed)
Patient returned call. Requests to be called at ph# [phone]

## 2022-07-30 DIAGNOSIS — Z1211 Encounter for screening for malignant neoplasm of colon: Secondary | ICD-10-CM | POA: Diagnosis not present

## 2022-08-15 LAB — COLOGUARD: COLOGUARD: NEGATIVE

## 2022-08-15 NOTE — Progress Notes (Signed)
Please inform patient of the following:  Good news! Cologuard is negative. We can recheck in a year.  Cory Shaw. Jimmey Ralph, MD 08/15/2022 12:50 PM

## 2022-08-15 NOTE — Progress Notes (Signed)
Good news! Cologuard was negative. WE can recheck in 3 years.

## 2022-09-16 ENCOUNTER — Ambulatory Visit (INDEPENDENT_AMBULATORY_CARE_PROVIDER_SITE_OTHER): Payer: 59 | Admitting: *Deleted

## 2022-09-16 DIAGNOSIS — Z23 Encounter for immunization: Secondary | ICD-10-CM | POA: Diagnosis not present

## 2022-09-16 NOTE — Progress Notes (Signed)
Per orders of Dr. Parker, injection of 2nd Shingles Vaccine given in right deltoid per patient preference by Cory Shaw, CMA. Patient tolerated injection well.  

## 2023-01-26 ENCOUNTER — Encounter: Payer: Self-pay | Admitting: Family Medicine

## 2023-01-26 ENCOUNTER — Ambulatory Visit: Payer: 59 | Admitting: Family Medicine

## 2023-01-26 VITALS — BP 124/86 | Ht 69.0 in | Wt 203.0 lb

## 2023-01-26 DIAGNOSIS — G8929 Other chronic pain: Secondary | ICD-10-CM | POA: Diagnosis not present

## 2023-01-26 DIAGNOSIS — M25562 Pain in left knee: Secondary | ICD-10-CM

## 2023-01-26 DIAGNOSIS — M25561 Pain in right knee: Secondary | ICD-10-CM | POA: Diagnosis not present

## 2023-01-26 DIAGNOSIS — M79674 Pain in right toe(s): Secondary | ICD-10-CM | POA: Diagnosis not present

## 2023-01-26 NOTE — Progress Notes (Signed)
  Cory Shaw - 57 y.o. male MRN [id number]  Date of birth: 07/17/1966  SUBJECTIVE:  Including CC & ROS.  CC: Right toe pain, bilateral knee pain  HPI: Patient is presenting for evaluation of right toe pain and bilateral knee pain. He has previously been seen for epicondylitis, which he states has been feeling well.   Knee pain started about a month ago when he noticed he tweaked his R knee while squatting heavy. He laid off that knee for a while and primarily exercised his L knee with pistol squats, which then also got tweaked. Since then he has felt some discomfort behind his kneecap when squatting or going up stairs. Denies any other injury to the knee. No swelling or pain at rest.   R toe pain has been worsening over the past year. He does boxing regularly and plants the R toe frequently. Hurts especially when dorsiflexed or in tight shoes such as boots. R toe has become bigger than L toe over the past year. Denies recent injury to toe, but notes he broke his toe around age 56 and was not treated by a professional. No pain at rest.    HISTORY: Past Medical, Surgical, Social, and Family History Reviewed & Updated per EMR.   Pertinent Historical Findings include: none   PHYSICAL EXAM:  VS: BP:124/86  HR: bpm  TEMP: ( )  RESP:   HT:5' 9"$  (175.3 cm)   WT:203 lb (92.1 kg)  BMI:29.96 PHYSICAL EXAM: Knee: - Inspection: no gross deformity. No swelling/effusion, erythema or bruising. Skin intact - Palpation: no TTP - ROM: full active ROM with flexion and extension in knee and hip - Strength: 5/5 strength - Neuro/vasc: NV intact - Special Tests: - LIGAMENTS: negative anterior and posterior drawer, negative Lachman's, no MCL or LCL laxity  -- MENISCUS: negative McMurray's -- PF JOINT: nml patellar mobility bilaterally. Negative patellar grind, negative patellar apprehension  Hips: normal ROM, negative FABER and FADIR bilaterally  Right Foot: Inspection:  Bony enlargement of R 1st MTP  dorsally. No swelling, erythema, or bruising.  Normal arch Palpation: No tenderness to palpation ROM: Full  ROM of the ankle. Normal midfoot flexibility. 1st toe dorsiflexion limited on R.  Strength: 5/5 strength ankle in all planes Neurovascular: N/V intact distally in the lower extremity Special tests: Negative anterior drawer, talar tilt, calcaneal squeeze.    ASSESSMENT & PLAN: See problem based charting & AVS for pt instructions.  Bilateral knee pain  Patellofemoral pain syndrome - benign knee exam. Provided home exercises for quad/hip strengthening. Can consider formal PT if no improvement.  R toe pain  Osteoarthritis - overuse from boxing likely has led to asymmetric bony growth on R toe. Can consider first ray post for symptomatic relief. If continues to be symptomatic or worsens, could consider surgical removal of osteophyte.  Follow up in 6 weeks or as needed.    Estevan Oaks, [place] [facility]

## 2023-01-26 NOTE — Patient Instructions (Signed)
You have patellofemoral syndrome. Avoid painful activities when possible (often deep squats, lunges, leg press bother this). Do home exercises as directed once a day. Consider formal physical therapy. Wear shoes with good arch support. Avoid flat shoes, barefoot walking as much as possible. Icing 15 minutes at a time 3-4 times a day as needed. Tylenol or ibuprofen as needed for pain. Follow up with me in 6 weeks.  Your great toe pain is due to fairly advanced arthritis in this 1st MTP joint. One thing that typically helps with this is a first ray post - bring your shoes in that you use most frequently (especially the ONs you use for boxing) and we'll place this pad for you. Otherwise activities as tolerated, icing if needed, voltaren gel or ibuprofen if needed. If bothersome enough this spur can be surgically removed though usually comes back over time.

## 2023-01-28 LAB — HM DIABETES EYE EXAM

## 2023-02-11 ENCOUNTER — Encounter: Payer: Self-pay | Admitting: Family Medicine

## 2023-03-02 ENCOUNTER — Other Ambulatory Visit (HOSPITAL_COMMUNITY): Payer: Self-pay

## 2023-03-02 MED ORDER — AMOXICILLIN 500 MG PO CAPS
500.0000 mg | ORAL_CAPSULE | Freq: Four times a day (QID) | ORAL | 0 refills | Status: DC
  Filled 2023-03-02: qty 30, 8d supply, fill #0

## 2023-07-14 ENCOUNTER — Encounter: Payer: Self-pay | Admitting: Family Medicine

## 2023-07-15 ENCOUNTER — Other Ambulatory Visit: Payer: Self-pay | Admitting: *Deleted

## 2023-07-15 DIAGNOSIS — R739 Hyperglycemia, unspecified: Secondary | ICD-10-CM

## 2023-07-15 DIAGNOSIS — E785 Hyperlipidemia, unspecified: Secondary | ICD-10-CM

## 2023-07-15 DIAGNOSIS — Z0000 Encounter for general adult medical examination without abnormal findings: Secondary | ICD-10-CM

## 2023-07-16 ENCOUNTER — Other Ambulatory Visit (INDEPENDENT_AMBULATORY_CARE_PROVIDER_SITE_OTHER): Payer: Managed Care, Other (non HMO)

## 2023-07-16 DIAGNOSIS — Z0000 Encounter for general adult medical examination without abnormal findings: Secondary | ICD-10-CM | POA: Diagnosis not present

## 2023-07-16 DIAGNOSIS — R739 Hyperglycemia, unspecified: Secondary | ICD-10-CM | POA: Diagnosis not present

## 2023-07-16 DIAGNOSIS — E785 Hyperlipidemia, unspecified: Secondary | ICD-10-CM

## 2023-07-16 LAB — COMPREHENSIVE METABOLIC PANEL
ALT: 66 U/L — ABNORMAL HIGH (ref 0–53)
AST: 48 U/L — ABNORMAL HIGH (ref 0–37)
Albumin: 4.5 g/dL (ref 3.5–5.2)
Alkaline Phosphatase: 44 U/L (ref 39–117)
BUN: 17 mg/dL (ref 6–23)
CO2: 30 mEq/L (ref 19–32)
Calcium: 9.4 mg/dL (ref 8.4–10.5)
Chloride: 102 mEq/L (ref 96–112)
Creatinine, Ser: 0.88 mg/dL (ref 0.40–1.50)
GFR: 96.84 mL/min (ref >60.00)
Glucose, Bld: 100 mg/dL — ABNORMAL HIGH (ref 70–99)
Potassium: 4 mEq/L (ref 3.5–5.1)
Sodium: 138 mEq/L (ref 135–145)
Total Bilirubin: 1.3 mg/dL — ABNORMAL HIGH (ref 0.2–1.2)
Total Protein: 7.4 g/dL (ref 6.0–8.3)

## 2023-07-16 LAB — LIPID PANEL
Cholesterol: 208 mg/dL — ABNORMAL HIGH (ref 0–200)
HDL: 50.9 mg/dL (ref >39.00)
LDL Cholesterol: 118 mg/dL — ABNORMAL HIGH (ref 0–99)
NonHDL: 156.67
Total CHOL/HDL Ratio: 4
Triglycerides: 191 mg/dL — ABNORMAL HIGH (ref 0.0–149.0)
VLDL: 38.2 mg/dL (ref 0.0–40.0)

## 2023-07-16 LAB — CBC
HCT: 41.9 % (ref 39.0–52.0)
Hemoglobin: 13.9 g/dL (ref 13.0–17.0)
MCHC: 33.2 g/dL (ref 30.0–36.0)
MCV: 91.4 fl (ref 78.0–100.0)
Platelets: 136 10*3/uL — ABNORMAL LOW (ref 150.0–400.0)
RBC: 4.59 Mil/uL (ref 4.22–5.81)
RDW: 13.6 % (ref 11.5–15.5)
WBC: 5.2 10*3/uL (ref 4.0–10.5)

## 2023-07-16 LAB — TSH: TSH: 1.05 u[IU]/mL (ref 0.35–5.50)

## 2023-07-16 LAB — HEMOGLOBIN A1C: Hgb A1c MFr Bld: 6.8 % — ABNORMAL HIGH (ref 4.6–6.5)

## 2023-07-20 ENCOUNTER — Other Ambulatory Visit (HOSPITAL_BASED_OUTPATIENT_CLINIC_OR_DEPARTMENT_OTHER): Payer: Self-pay

## 2023-07-20 ENCOUNTER — Ambulatory Visit (INDEPENDENT_AMBULATORY_CARE_PROVIDER_SITE_OTHER): Payer: Managed Care, Other (non HMO) | Admitting: Family Medicine

## 2023-07-20 ENCOUNTER — Encounter: Payer: Self-pay | Admitting: Family Medicine

## 2023-07-20 ENCOUNTER — Telehealth: Payer: Self-pay | Admitting: *Deleted

## 2023-07-20 ENCOUNTER — Encounter (HOSPITAL_BASED_OUTPATIENT_CLINIC_OR_DEPARTMENT_OTHER): Payer: Self-pay

## 2023-07-20 VITALS — BP 142/79 | HR 60 | Temp 97.8°F | Ht 69.0 in | Wt 215.0 lb

## 2023-07-20 DIAGNOSIS — Z7985 Long-term (current) use of injectable non-insulin antidiabetic drugs: Secondary | ICD-10-CM | POA: Diagnosis not present

## 2023-07-20 DIAGNOSIS — D696 Thrombocytopenia, unspecified: Secondary | ICD-10-CM

## 2023-07-20 DIAGNOSIS — E1169 Type 2 diabetes mellitus with other specified complication: Secondary | ICD-10-CM | POA: Diagnosis not present

## 2023-07-20 DIAGNOSIS — Z0001 Encounter for general adult medical examination with abnormal findings: Secondary | ICD-10-CM

## 2023-07-20 DIAGNOSIS — Z0000 Encounter for general adult medical examination without abnormal findings: Secondary | ICD-10-CM | POA: Diagnosis not present

## 2023-07-20 DIAGNOSIS — E785 Hyperlipidemia, unspecified: Secondary | ICD-10-CM | POA: Diagnosis not present

## 2023-07-20 DIAGNOSIS — R748 Abnormal levels of other serum enzymes: Secondary | ICD-10-CM

## 2023-07-20 MED ORDER — TIRZEPATIDE 2.5 MG/0.5ML ~~LOC~~ SOAJ
2.5000 mg | SUBCUTANEOUS | 0 refills | Status: DC
  Filled 2023-07-20: qty 2, 28d supply, fill #0

## 2023-07-20 MED ORDER — TIRZEPATIDE 5 MG/0.5ML ~~LOC~~ SOAJ
5.0000 mg | SUBCUTANEOUS | 0 refills | Status: DC
  Filled 2023-07-20 – 2023-07-27 (×2): qty 2, 28d supply, fill #0
  Filled 2023-09-03: qty 2, 28d supply, fill #1
  Filled 2023-10-12 – 2023-10-14 (×2): qty 2, 28d supply, fill #2

## 2023-07-20 NOTE — Telephone Encounter (Signed)
(  Key: [id number]) - [id number] Mounjaro 2.5MG /0.5ML pen-injectors Status: PA RequestCreated: August 12th, 2024 [id number] Sent: August 12th, 2024 Waiting for determination

## 2023-07-20 NOTE — Assessment & Plan Note (Signed)
Stable on recent CBC

## 2023-07-20 NOTE — Progress Notes (Signed)
Discussed during office visit.  Cory Shaw. Jimmey Ralph, MD 07/20/2023 2:22 PM

## 2023-07-20 NOTE — Patient Instructions (Signed)
It was very nice to see you today!  We will start Mounjaro.  Take 2.5 mg weekly for 4 weeks and then increase to 5 mg weekly.  Please continue to work on diet and exercise.  Will see back in 3 months.  Come back sooner if needed.  Return in about 3 months (around 10/20/2023) for Follow Up.   Take care, Dr Jimmey Ralph  PLEASE NOTE:  If you had any lab tests, please let us know if you have not heard back within a few days. You may see your results on mychart before we have a chance to review them but we will give you a call once they are reviewed by [place].   If we ordered any referrals today, please let us know if you have not heard from their office within the next week.   If you had any urgent prescriptions sent in today, please check with the pharmacy within an hour of our visit to make sure the prescription was transmitted appropriately.   Please try these tips to maintain a healthy lifestyle:  Eat at least 3 REAL meals and 1-2 snacks per day.  Aim for no more than 5 hours between eating.  If you eat breakfast, please do so within one hour of getting up.   Each meal should contain half fruits/vegetables, one quarter protein, and one quarter carbs (no bigger than a computer mouse)  Cut down on sweet beverages. This includes juice, soda, and sweet tea.   Drink at least 1 glass of water with each meal and aim for at least 8 glasses per day  Exercise at least 150 minutes every week.    Preventive Care 28-26 Years Old, Male Preventive care refers to lifestyle choices and visits with your health care provider that can promote health and wellness. Preventive care visits are also called wellness exams. What can I expect for my preventive care visit? Counseling During your preventive care visit, your health care provider may ask about your: Medical history, including: Past medical problems. Family medical history. Current health, including: Emotional well-being. Home life and relationship  well-being. Sexual activity. Lifestyle, including: Alcohol, nicotine or tobacco, and drug use. Access to firearms. Diet, exercise, and sleep habits. Safety issues such as seatbelt and bike helmet use. Sunscreen use. Work and work Astronomer. Physical exam Your health care provider will check your: Height and weight. These may be used to calculate your BMI (body mass index). BMI is a measurement that tells if you are at a healthy weight. Waist circumference. This measures the distance around your waistline. This measurement also tells if you are at a healthy weight and may help predict your risk of certain diseases, such as type 2 diabetes and high blood pressure. Heart rate and blood pressure. Body temperature. Skin for abnormal spots. What immunizations do I need?  Vaccines are usually given at various ages, according to a schedule. Your health care provider will recommend vaccines for you based on your age, medical history, and lifestyle or other factors, such as travel or where you work. What tests do I need? Screening Your health care provider may recommend screening tests for certain conditions. This may include: Lipid and cholesterol levels. Diabetes screening. This is done by checking your blood sugar (glucose) after you have not eaten for a while (fasting). Hepatitis B test. Hepatitis C test. HIV (human immunodeficiency virus) test. STI (sexually transmitted infection) testing, if you are at risk. Lung cancer screening. Prostate cancer screening. Colorectal cancer screening. Talk  with your health care provider about your test results, treatment options, and if necessary, the need for more tests. Follow these instructions at home: Eating and drinking  Eat a diet that includes fresh fruits and vegetables, whole grains, lean protein, and low-fat dairy products. Take vitamin and mineral supplements as recommended by your health care provider. Do not drink alcohol if your  health care provider tells you not to drink. If you drink alcohol: Limit how much you have to 0-2 drinks a day. Know how much alcohol is in your drink. In the [place] one drink equals one 12 oz bottle of beer (355 mL), one 5 oz glass of wine (148 mL), or one 1 oz glass of hard liquor (44 mL). Lifestyle Brush your teeth every morning and night with fluoride toothpaste. Floss one time each day. Exercise for at least 30 minutes 5 or more days each week. Do not use any products that contain nicotine or tobacco. These products include cigarettes, chewing tobacco, and vaping devices, such as e-cigarettes. If you need help quitting, ask your health care provider. Do not use drugs. If you are sexually active, practice safe sex. Use a condom or other form of protection to prevent STIs. Take aspirin only as told by your health care provider. Make sure that you understand how much to take and what form to take. Work with your health care provider to find out whether it is safe and beneficial for you to take aspirin daily. Find healthy ways to manage stress, such as: Meditation, yoga, or listening to music. Journaling. Talking to a trusted person. Spending time with friends and family. Minimize exposure to UV radiation to reduce your risk of skin cancer. Safety Always wear your seat belt while driving or riding in a vehicle. Do not drive: If you have been drinking alcohol. Do not ride with someone who has been drinking. When you are tired or distracted. While texting. If you have been using any mind-altering substances or drugs. Wear a helmet and other protective equipment during sports activities. If you have firearms in your house, make sure you follow all gun safety procedures. What's next? Go to your health care provider once a year for an annual wellness visit. Ask your health care provider how often you should have your eyes and teeth checked. Stay up to date on all vaccines. This information  is not intended to replace advice given to you by your health care provider. Make sure you discuss any questions you have with your health care provider. Document Revised: 05/22/2021 Document Reviewed: 05/22/2021 Elsevier Patient Education  2024 [employer].

## 2023-07-20 NOTE — Assessment & Plan Note (Signed)
A1c 6.8 on recent labs.  He has not been able to tolerate metformin in the past.  He is working on diet and exercise and is trying to cut down on carbs.  We discussed medication options.  We will start Mounjaro 2.5 mg weekly for 4 weeks and then increase to 5 mg weekly.  He will come back in 3 months and we can recheck A1c at that time.

## 2023-07-20 NOTE — Progress Notes (Signed)
Chief Complaint:  Cory Shaw is a 57 y.o. male who presents today for his annual comprehensive physical exam.    Assessment/Plan:  New/Acute Problems: Elevated BP Reading  Mildly elevated 142/79 today.  We starting [facility] which should hopefully help with some of his dietary indiscretions.  He will continue to monitor at home and we will recheck again at next office visit.  If persistently elevated will need to start antihypertensive medication.  Chronic Problems Addressed Today: T2DM (type 2 diabetes mellitus) (HCC) A1c 6.8 on recent labs.  He has not been able to tolerate metformin in the past.  He is working on diet and exercise and is trying to cut down on carbs.  We discussed medication options.  We will start Mounjaro 2.5 mg weekly for 4 weeks and then increase to 5 mg weekly.  He will come back in 3 months and we can recheck A1c at that time.  Dyslipidemia LDL 119.  Will be starting Mounjaro as above which should help with some of his eating habits.  We can recheck lipids in 3-6 months.   Abnormal transaminases Mildly elevated on recent labs.  Likely medication induced.  We can recheck again in several weeks.  Thrombocytopenia (HCC) Stable on recent CBC.  Preventative Healthcare: Up-to-date on cancer screening and vaccines.  Patient Counseling(The following topics were reviewed and/or handout was given):  -Nutrition: Stressed importance of moderation in sodium/caffeine intake, saturated fat and cholesterol, caloric balance, sufficient intake of fresh fruits, vegetables, and fiber.  -Stressed the importance of regular exercise.   -Substance Abuse: Discussed cessation/primary prevention of tobacco, alcohol, or other drug use; driving or other dangerous activities under the influence; availability of treatment for abuse.   -Injury prevention: Discussed safety belts, safety helmets, smoke detector, smoking near bedding or upholstery.   -Sexuality: Discussed sexually  transmitted diseases, partner selection, use of condoms, avoidance of unintended pregnancy and contraceptive alternatives.   -Dental health: Discussed importance of regular tooth brushing, flossing, and dental visits.  -Health maintenance and immunizations reviewed. Please refer to Health maintenance section.  Return to care in 1 year for next preventative visit.     Subjective:  HPI:  He has no acute complaints today. See Assessment / plan for status of chronic conditions.  He had labs done last week.  He is concerned about elevated A1c of 6.8.  Also concerned about elevated LFTs.  Does admit to taking more over-the-counter pain meds recently due to orthopedic injuries.  He stopped taking these several days ago.  He has been try to work on diet and exercise.  Lifestyle Diet: Trying to cut down on snacking. Overall eats healthy meals.  Exercise: Works out daily. Weight lifting and boxing.      07/20/2023    7:56 AM  Depression screen PHQ 2/9  Decreased Interest 0  Down, Depressed, Hopeless 0  PHQ - 2 Score 0    Health Maintenance Due  Topic Date Due   Diabetic kidney evaluation - Urine ACR  05/31/2020   FOOT EXAM  05/31/2020     ROS: Per HPI, otherwise a complete review of systems was negative.   PMH:  The following were reviewed and entered/updated in epic: Past Medical History:  Diagnosis Date   Allergy    Pneumonia 2010   PONV (postoperative nausea and vomiting)    No amnesia causing drugs   Seasonal allergies    Patient Active Problem List   Diagnosis Date Noted   Dyslipidemia 06/03/2019  T2DM (type 2 diabetes mellitus) (HCC) 06/03/2019   Eczema 10/25/2018   Bilateral shoulder pain 05/21/2018   Thrombocytopenia (HCC) 10/15/2017   Abnormal transaminases 10/15/2017   Accessory bone of foot 08/27/2017   Chronic rhinitis 07/23/2012   Chronic respiratory failure (HCC) 07/23/2012   Pulmonary infiltrates 06/22/2012   Past Surgical History:  Procedure Laterality  Date   HERNIA REPAIR     transpositon of ulna nerve Left 2009    Family History  Problem Relation Age of Onset   Other Brother        NETS   Diabetes Mother    Pneumonia Mother    Atrial fibrillation Mother    Heart attack Maternal Grandfather    Prostate cancer Neg Hx     Medications- reviewed and updated Current Outpatient Medications  Medication Sig Dispense Refill   fluticasone (FLONASE) 50 MCG/ACT nasal spray Place into both nostrils daily.     Multiple Vitamin (MULTIVITAMIN) capsule Take 1 capsule by mouth daily.     tirzepatide [facility]) 2.5 MG/0.5ML Pen Inject 2.5 mg into the skin once a week. Take for 4 weeks then increase to 5 mg weekly dose. 2 mL 0   tirzepatide (MOUNJARO) 5 MG/0.5ML Pen Inject 5 mg into the skin once a week. 6 mL 0   No current facility-administered medications for this visit.    Allergies-reviewed and updated Allergies  Allergen Reactions   Sulfa Antibiotics Hives    Social History   Socioeconomic History   Marital status: Married    Spouse name: Not on file   Number of children: Not on file   Years of education: Not on file   Highest education level: Not on file  Occupational History   Occupation: Stage manager for [employer]     Employer: [employer]  Tobacco Use   Smoking status: Never   Smokeless tobacco: Never  Vaping Use   Vaping status: Never Used  Substance and Sexual Activity   Alcohol use: Yes    Alcohol/week: 1.0 standard drink of alcohol    Types: 1 Cans of beer per week   Drug use: No   Sexual activity: Yes  Other Topics Concern   Not on file  Social History Narrative   Not on file   Social Determinants of Health   Financial Resource Strain: Not on file  Food Insecurity: Not on file  Transportation Needs: Not on file  Physical Activity: Not on file  Stress: Not on file  Social Connections: Not on file        Objective:  Physical Exam: BP (!) 142/79   Pulse 60   Temp 97.8 F (36.6 C) (Temporal)   Ht 5\' 9"   (1.753 m)   Wt 215 lb (97.5 kg)   SpO2 98%   BMI 31.75 kg/m   Body mass index is 31.75 kg/m. Wt Readings from Last 3 Encounters:  07/20/23 215 lb (97.5 kg)  01/26/23 203 lb (92.1 kg)  07/15/22 207 lb 12.8 oz (94.3 kg)   Gen: NAD, resting comfortably HEENT: TMs normal bilaterally. OP clear. No thyromegaly noted.  CV: RRR with no murmurs appreciated Pulm: NWOB, CTAB with no crackles, wheezes, or rhonchi GI: Normal bowel sounds present. Soft, Nontender, Nondistended. MSK: no edema, cyanosis, or clubbing noted Skin: warm, dry Neuro: CN2-12 grossly intact. Strength 5/5 in upper and lower extremities. Reflexes symmetric and intact bilaterally.  Psych: Normal affect and thought content     Kanitra Purifoy M. Jimmey Ralph, MD 07/20/2023 8:39 AM

## 2023-07-20 NOTE — Assessment & Plan Note (Signed)
Mildly elevated on recent labs.  Likely medication induced.  We can recheck again in several weeks.

## 2023-07-20 NOTE — Assessment & Plan Note (Addendum)
LDL 119.  Will be starting Mounjaro as above which should help with some of his eating habits.  We can recheck lipids in 3-6 months.

## 2023-07-21 NOTE — Telephone Encounter (Signed)
Approved on August 12 by [facility] 2017 CaseId:[id number];Status:Approved;Review Type:Prior Auth;Coverage Start Date:07/20/2023;Coverage End Date:07/19/2024; Authorization Expiration Date: 07/18/2024

## 2023-07-22 ENCOUNTER — Other Ambulatory Visit (HOSPITAL_BASED_OUTPATIENT_CLINIC_OR_DEPARTMENT_OTHER): Payer: Self-pay

## 2023-07-27 ENCOUNTER — Other Ambulatory Visit: Payer: Self-pay

## 2023-07-27 ENCOUNTER — Other Ambulatory Visit (HOSPITAL_BASED_OUTPATIENT_CLINIC_OR_DEPARTMENT_OTHER): Payer: Self-pay

## 2023-07-29 ENCOUNTER — Other Ambulatory Visit (HOSPITAL_COMMUNITY): Payer: Self-pay

## 2023-10-12 ENCOUNTER — Other Ambulatory Visit (HOSPITAL_BASED_OUTPATIENT_CLINIC_OR_DEPARTMENT_OTHER): Payer: Self-pay

## 2023-10-14 ENCOUNTER — Encounter: Payer: Self-pay | Admitting: Family Medicine

## 2023-10-14 ENCOUNTER — Other Ambulatory Visit (HOSPITAL_COMMUNITY): Payer: Self-pay

## 2023-10-15 NOTE — Telephone Encounter (Signed)
We can obtain a point of care A1c on the day of his appointment.  Katina Degree. Jimmey Ralph, MD 10/15/2023 9:59 AM

## 2023-10-15 NOTE — Telephone Encounter (Signed)
Please advise 

## 2023-10-21 ENCOUNTER — Ambulatory Visit (INDEPENDENT_AMBULATORY_CARE_PROVIDER_SITE_OTHER): Payer: Managed Care, Other (non HMO) | Admitting: Family Medicine

## 2023-10-21 ENCOUNTER — Other Ambulatory Visit (HOSPITAL_BASED_OUTPATIENT_CLINIC_OR_DEPARTMENT_OTHER): Payer: Self-pay

## 2023-10-21 VITALS — BP 121/77 | HR 60 | Temp 98.0°F | Ht 69.0 in | Wt 202.4 lb

## 2023-10-21 DIAGNOSIS — Z7985 Long-term (current) use of injectable non-insulin antidiabetic drugs: Secondary | ICD-10-CM

## 2023-10-21 DIAGNOSIS — Z6829 Body mass index (BMI) 29.0-29.9, adult: Secondary | ICD-10-CM | POA: Diagnosis not present

## 2023-10-21 DIAGNOSIS — E1169 Type 2 diabetes mellitus with other specified complication: Secondary | ICD-10-CM | POA: Diagnosis not present

## 2023-10-21 DIAGNOSIS — E669 Obesity, unspecified: Secondary | ICD-10-CM | POA: Diagnosis not present

## 2023-10-21 LAB — POCT GLYCOSYLATED HEMOGLOBIN (HGB A1C): Hemoglobin A1C: 5 % (ref 4.0–5.6)

## 2023-10-21 MED ORDER — TIRZEPATIDE 7.5 MG/0.5ML ~~LOC~~ SOAJ
7.5000 mg | SUBCUTANEOUS | 0 refills | Status: DC
  Filled 2023-10-21: qty 2, 28d supply, fill #0

## 2023-10-21 NOTE — Assessment & Plan Note (Signed)
Patient down 12 pounds since last time.  He is doing well with Mounjaro.  Will increase dose to 7.5 mg weekly.  He will continue to work on diet and exercise.  He will follow-up with [place] in a few weeks via MyChart we can titrate as needed.

## 2023-10-21 NOTE — Patient Instructions (Signed)
It was very nice to see you today!  We will increase Mounjaro to 7.5.  Send me message in a few weeks via MyChart to let me know how you are doing.  Return in about 3 months (around 01/21/2024) for Follow Up.   Take care, Dr Jimmey Ralph  PLEASE NOTE:  If you had any lab tests, please let us know if you have not heard back within a few days. You may see your results on mychart before we have a chance to review them but we will give you a call once they are reviewed by [place].   If we ordered any referrals today, please let us know if you have not heard from their office within the next week.   If you had any urgent prescriptions sent in today, please check with the pharmacy within an hour of our visit to make sure the prescription was transmitted appropriately.   Please try these tips to maintain a healthy lifestyle:  Eat at least 3 REAL meals and 1-2 snacks per day.  Aim for no more than 5 hours between eating.  If you eat breakfast, please do so within one hour of getting up.   Each meal should contain half fruits/vegetables, one quarter protein, and one quarter carbs (no bigger than a computer mouse)  Cut down on sweet beverages. This includes juice, soda, and sweet tea.   Drink at least 1 glass of water with each meal and aim for at least 8 glasses per day  Exercise at least 150 minutes every week.

## 2023-10-21 NOTE — Assessment & Plan Note (Addendum)
A1c improved to 5.0.  Congratulated him on A1c reduction. He is doing well with Mounjaro.  Will increase dose to 7.5 mg weekly.  He will follow-up with [place] in a few weeks via MyChart.  He will come back in 3 months for A1c recheck.

## 2023-10-21 NOTE — Progress Notes (Signed)
   Cory Shaw is a 57 y.o. male who presents today for an office visit.  Assessment/Plan:  Chronic Problems Addressed Today: T2DM (type 2 diabetes mellitus) (HCC) A1c improved to 5.0.  He is doing well with Mounjaro.  Will increase dose to 7.5 mg weekly.  He will follow-up with [place] in a few weeks via MyChart.  Come back in 3 months for A1c recheck.  Obesity Patient down 12 pounds since last time.  He is doing well with Mounjaro.  Will increase dose to 7.5 mg weekly.  He will continue to work on diet and exercise.  He will follow-up with [place] in a few weeks via MyChart we can titrate as needed.    Subjective:  HPI:  See Assessment / plan for status of chronic conditions.  Patient is here today for follow-up.  He was seen here 3 months ago for annual physical.  That time A1c was 6.8.  We started him on Mounjaro.  He high deductible savings account done well with this. He has had some GI side effects but this has been manageable. He has continued to work on diet and exercise.        Objective:  Physical Exam: BP 121/77   Pulse 60   Temp 98 F (36.7 C) (Temporal)   Ht 5\' 9"  (1.753 m)   Wt 202 lb 6.4 oz (91.8 kg)   SpO2 97%   BMI 29.89 kg/m   Wt Readings from Last 3 Encounters:  10/21/23 202 lb 6.4 oz (91.8 kg)  07/20/23 215 lb (97.5 kg)  01/26/23 203 lb (92.1 kg)  Gen: No acute distress, resting comfortably Neuro: Grossly normal, moves all extremities Psych: Normal affect and thought content      Christella App M. Jimmey Ralph, MD 10/21/2023 7:50 AM

## 2023-11-02 ENCOUNTER — Other Ambulatory Visit (HOSPITAL_BASED_OUTPATIENT_CLINIC_OR_DEPARTMENT_OTHER): Payer: Self-pay

## 2023-11-02 DIAGNOSIS — M25562 Pain in left knee: Secondary | ICD-10-CM | POA: Diagnosis not present

## 2023-11-02 MED ORDER — MELOXICAM 15 MG PO TABS
15.0000 mg | ORAL_TABLET | Freq: Every day | ORAL | 0 refills | Status: DC
  Filled 2023-11-02: qty 14, 14d supply, fill #0

## 2023-11-17 ENCOUNTER — Other Ambulatory Visit: Payer: Self-pay | Admitting: Family Medicine

## 2023-11-17 ENCOUNTER — Other Ambulatory Visit (HOSPITAL_BASED_OUTPATIENT_CLINIC_OR_DEPARTMENT_OTHER): Payer: Self-pay

## 2023-11-17 DIAGNOSIS — M25562 Pain in left knee: Secondary | ICD-10-CM | POA: Diagnosis not present

## 2023-11-17 MED ORDER — MOUNJARO 7.5 MG/0.5ML ~~LOC~~ SOAJ
7.5000 mg | SUBCUTANEOUS | 0 refills | Status: DC
  Filled 2023-11-17: qty 2, 28d supply, fill #0

## 2023-11-17 MED ORDER — MELOXICAM 15 MG PO TABS
15.0000 mg | ORAL_TABLET | Freq: Every day | ORAL | 1 refills | Status: DC | PRN
  Filled 2023-11-17: qty 30, 30d supply, fill #0

## 2023-12-21 ENCOUNTER — Telehealth: Payer: Managed Care, Other (non HMO) | Admitting: Emergency Medicine

## 2023-12-21 ENCOUNTER — Other Ambulatory Visit (HOSPITAL_BASED_OUTPATIENT_CLINIC_OR_DEPARTMENT_OTHER): Payer: Self-pay

## 2023-12-21 DIAGNOSIS — J189 Pneumonia, unspecified organism: Secondary | ICD-10-CM | POA: Diagnosis not present

## 2023-12-21 MED ORDER — AMOXICILLIN-POT CLAVULANATE 875-125 MG PO TABS
1.0000 | ORAL_TABLET | Freq: Two times a day (BID) | ORAL | 0 refills | Status: DC
  Filled 2023-12-21: qty 14, 7d supply, fill #0

## 2023-12-21 MED ORDER — AZITHROMYCIN 250 MG PO TABS
ORAL_TABLET | ORAL | 0 refills | Status: AC
  Filled 2023-12-21: qty 6, 5d supply, fill #0

## 2023-12-21 NOTE — Progress Notes (Signed)
 Virtual Visit Consent   Cory Shaw, you are scheduled for a virtual visit with a [facility] provider today. Just as with appointments in the office, your consent must be obtained to participate. Your consent will be active for this visit and any virtual visit you may have with one of our providers in the next 365 days. If you have a MyChart account, a copy of this consent can be sent to you electronically.  As this is a virtual visit, video technology does not allow for your provider to perform a traditional examination. This may limit your provider's ability to fully assess your condition. If your provider identifies any concerns that need to be evaluated in person or the need to arrange testing (such as labs, EKG, etc.), we will make arrangements to do so. Although advances in technology are sophisticated, we cannot ensure that it will always work on either your end or our end. If the connection with a video visit is poor, the visit may have to be switched to a telephone visit. With either a video or telephone visit, we are not always able to ensure that we have a secure connection.  By engaging in this virtual visit, you consent to the provision of healthcare and authorize for your insurance to be billed (if applicable) for the services provided during this visit. Depending on your insurance coverage, you may receive a charge related to this service.  I need to obtain your verbal consent now. Are you willing to proceed with your visit today? Cory Shaw has provided verbal consent on 12/21/2023 for a virtual visit (video or telephone). Jon CHRISTELLA Belt, NP  Date: 12/21/2023 8:09 AM  Virtual Visit via Video Note   I, Jon CHRISTELLA Belt, connected with  Cory Shaw  ([id number], 1966/11/27) on 12/21/23 at  8:00 AM EST by a video-enabled telemedicine application and verified that I am speaking with the correct person using two identifiers.  Location: Patient: Virtual Visit Location Patient:  Home Provider: Virtual Visit Location Provider: Home Office   I discussed the limitations of evaluation and management by telemedicine and the availability of in person appointments. The patient expressed understanding and agreed to proceed.    History of Present Illness: Cory Shaw is a 59 y.o. who identifies as a male who was assigned male at birth, and is being seen today for 17 days of illness. Has brown sputum. STarted as a uri - now has no upper resp sx and only has chest congestion with discolored foul tasting sputum. No fever or chills no SOB or wheezing  HPI: HPI  Problems:  Patient Active Problem List   Diagnosis Date Noted   Obesity 10/21/2023   Dyslipidemia 06/03/2019   T2DM (type 2 diabetes mellitus) (HCC) 06/03/2019   Eczema 10/25/2018   Bilateral shoulder pain 05/21/2018   Thrombocytopenia (HCC) 10/15/2017   Abnormal transaminases 10/15/2017   Accessory bone of foot 08/27/2017   Chronic rhinitis 07/23/2012   Chronic respiratory failure (HCC) 07/23/2012   Pulmonary infiltrates 06/22/2012    Allergies:  Allergies  Allergen Reactions   Sulfa Antibiotics Hives   Medications:  Current Outpatient Medications:    amoxicillin -clavulanate (AUGMENTIN ) 875-125 MG tablet, Take 1 tablet by mouth 2 (two) times daily., Disp: 14 tablet, Rfl: 0   azithromycin  (ZITHROMAX ) 250 MG tablet, Take 2 tablets on day 1, then 1 tablet daily on days 2 through 5, Disp: 6 tablet, Rfl: 0   fluticasone (FLONASE) 50 MCG/ACT nasal spray, Place into  both nostrils daily., Disp: , Rfl:    meloxicam  (MOBIC ) 15 MG tablet, Take 1 tablet (15 mg total) by mouth daily., Disp: 14 tablet, Rfl: 0   meloxicam  (MOBIC ) 15 MG tablet, Take 1 tablet (15 mg total) by mouth daily as needed., Disp: 30 tablet, Rfl: 1   Multiple Vitamin (MULTIVITAMIN) capsule, Take 1 capsule by mouth daily., Disp: , Rfl:    tirzepatide  (MOUNJARO ) 7.5 MG/0.5ML Pen, Inject 7.5 mg into the skin once a week., Disp: 2 mL, Rfl:  0  Observations/Objective: Patient is well-developed, well-nourished in no acute distress.  Resting comfortably  at home.  Head is normocephalic, atraumatic.  No labored breathing.  Speech is clear and coherent with logical content.  Patient is alert and oriented at baseline.    Assessment and Plan: 1. Community acquired pneumonia, unspecified laterality (Primary)  REc mucinex and drinking plenty of fluids  Follow Up Instructions: I discussed the assessment and treatment plan with the patient. The patient was provided an opportunity to ask questions and all were answered. The patient agreed with the plan and demonstrated an understanding of the instructions.  A copy of instructions were sent to the patient via MyChart unless otherwise noted below.   The patient was advised to call back or seek an in-person evaluation if the symptoms worsen or if the condition fails to improve as anticipated.    Jon CHRISTELLA Belt, NP

## 2023-12-21 NOTE — Patient Instructions (Signed)
  Cory Shaw, thank you for joining Jon CHRISTELLA Belt, NP for today's virtual visit.  While this provider is not your primary care provider (PCP), if your PCP is located in our provider database this encounter information will be shared with them immediately following your visit.   A [facility] MyChart account gives you access to today's visit and all your visits, tests, and labs performed at [facility]  click here if you don't have a [facility] MyChart account or go to [email or web address]  Consent: (Patient) Cory Shaw provided verbal consent for this virtual visit at the beginning of the encounter.  Current Medications:  Current Outpatient Medications:    amoxicillin -clavulanate (AUGMENTIN ) 875-125 MG tablet, Take 1 tablet by mouth 2 (two) times daily., Disp: 14 tablet, Rfl: 0   azithromycin  (ZITHROMAX ) 250 MG tablet, Take 2 tablets on day 1, then 1 tablet daily on days 2 through 5, Disp: 6 tablet, Rfl: 0   fluticasone (FLONASE) 50 MCG/ACT nasal spray, Place into both nostrils daily., Disp: , Rfl:    meloxicam  (MOBIC ) 15 MG tablet, Take 1 tablet (15 mg total) by mouth daily., Disp: 14 tablet, Rfl: 0   meloxicam  (MOBIC ) 15 MG tablet, Take 1 tablet (15 mg total) by mouth daily as needed., Disp: 30 tablet, Rfl: 1   Multiple Vitamin (MULTIVITAMIN) capsule, Take 1 capsule by mouth daily., Disp: , Rfl:    tirzepatide  (MOUNJARO ) 7.5 MG/0.5ML Pen, Inject 7.5 mg into the skin once a week., Disp: 2 mL, Rfl: 0   Medications ordered in this encounter:  Meds ordered this encounter  Medications   azithromycin  (ZITHROMAX ) 250 MG tablet    Sig: Take 2 tablets on day 1, then 1 tablet daily on days 2 through 5    Dispense:  6 tablet    Refill:  0   amoxicillin -clavulanate (AUGMENTIN ) 875-125 MG tablet    Sig: Take 1 tablet by mouth 2 (two) times daily.    Dispense:  14 tablet    Refill:  0     *If you need refills on other medications prior to your next appointment, please  contact your pharmacy*  Follow-Up: Call back or seek an in-person evaluation if the symptoms worsen or if the condition fails to improve as anticipated.  [facility] [phone]  Other Instructions Please take Mucinex to help thin out the mucus and help it pass. If you aren't getting better with this treatment, please be seen in person.    If you have been instructed to have an in-person evaluation today at a local Urgent Care facility, please use the link below. It will take you to a list of all of our available [facility], including address, phone number and hours of operation. Please do not delay care.  [facility]  If you or a family member do not have a primary care provider, use the link below to schedule a visit and establish care. When you choose a [facility] primary care physician or advanced practice provider, you gain a long-term partner in health. Find a Primary Care Provider  Learn more about [facility] in-office and virtual care options: [facility] Get Care Now

## 2023-12-28 ENCOUNTER — Other Ambulatory Visit (HOSPITAL_BASED_OUTPATIENT_CLINIC_OR_DEPARTMENT_OTHER): Payer: Self-pay

## 2023-12-28 ENCOUNTER — Other Ambulatory Visit: Payer: Self-pay | Admitting: Family Medicine

## 2023-12-28 MED ORDER — MOUNJARO 7.5 MG/0.5ML ~~LOC~~ SOAJ
7.5000 mg | SUBCUTANEOUS | 0 refills | Status: DC
  Filled 2023-12-28: qty 2, 28d supply, fill #0

## 2024-01-05 LAB — HM DIABETES EYE EXAM

## 2024-02-01 ENCOUNTER — Other Ambulatory Visit: Payer: Self-pay | Admitting: Family Medicine

## 2024-02-01 ENCOUNTER — Other Ambulatory Visit (HOSPITAL_BASED_OUTPATIENT_CLINIC_OR_DEPARTMENT_OTHER): Payer: Self-pay

## 2024-02-01 MED ORDER — MOUNJARO 7.5 MG/0.5ML ~~LOC~~ SOAJ
7.5000 mg | SUBCUTANEOUS | 0 refills | Status: DC
  Filled 2024-02-01: qty 2, 28d supply, fill #0

## 2024-02-29 ENCOUNTER — Encounter: Payer: Self-pay | Admitting: Family Medicine

## 2024-03-01 NOTE — Telephone Encounter (Signed)
**Note De-identified  [facility] Obfuscation** Please advise 

## 2024-03-02 NOTE — Telephone Encounter (Signed)
 Recommend he come in for Hemoglobin A1c check.  Katina Degree. Jimmey Ralph, MD 03/02/2024 7:28 AM

## 2024-03-03 ENCOUNTER — Other Ambulatory Visit (HOSPITAL_BASED_OUTPATIENT_CLINIC_OR_DEPARTMENT_OTHER): Payer: Self-pay

## 2024-03-03 ENCOUNTER — Other Ambulatory Visit: Payer: Self-pay

## 2024-03-03 ENCOUNTER — Other Ambulatory Visit (HOSPITAL_COMMUNITY): Payer: Self-pay

## 2024-03-03 ENCOUNTER — Ambulatory Visit: Admitting: Family Medicine

## 2024-03-03 ENCOUNTER — Encounter: Payer: Self-pay | Admitting: Family Medicine

## 2024-03-03 VITALS — BP 124/74 | HR 66 | Temp 98.2°F | Ht 69.0 in | Wt 198.6 lb

## 2024-03-03 DIAGNOSIS — M25511 Pain in right shoulder: Secondary | ICD-10-CM

## 2024-03-03 DIAGNOSIS — M25512 Pain in left shoulder: Secondary | ICD-10-CM

## 2024-03-03 DIAGNOSIS — E1169 Type 2 diabetes mellitus with other specified complication: Secondary | ICD-10-CM | POA: Diagnosis not present

## 2024-03-03 DIAGNOSIS — Z7985 Long-term (current) use of injectable non-insulin antidiabetic drugs: Secondary | ICD-10-CM

## 2024-03-03 DIAGNOSIS — M25562 Pain in left knee: Secondary | ICD-10-CM | POA: Diagnosis not present

## 2024-03-03 LAB — POCT GLYCOSYLATED HEMOGLOBIN (HGB A1C): Hemoglobin A1C: 5 % (ref 4.0–5.6)

## 2024-03-03 MED ORDER — TIRZEPATIDE 10 MG/0.5ML ~~LOC~~ SOAJ
10.0000 mg | SUBCUTANEOUS | 0 refills | Status: DC
  Filled 2024-03-03 (×2): qty 6, 84d supply, fill #0

## 2024-03-03 NOTE — Patient Instructions (Signed)
 It was very nice to see you today!  We will increase your Mounjaro to 10 mg weekly.  Let me know in a few weeks how this is working for you.  Please let us know if he would like to see physical therapy for your shoulders or knee.  Return in about 3 months (around 06/03/2024) for Follow Up.   Take care, Dr Jimmey Ralph  PLEASE NOTE:  If you had any lab tests, please let us know if you have not heard back within a few days. You may see your results on mychart before we have a chance to review them but we will give you a call once they are reviewed by [place].   If we ordered any referrals today, please let us know if you have not heard from their office within the next week.   If you had any urgent prescriptions sent in today, please check with the pharmacy within an hour of our visit to make sure the prescription was transmitted appropriately.   Please try these tips to maintain a healthy lifestyle:  Eat at least 3 REAL meals and 1-2 snacks per day.  Aim for no more than 5 hours between eating.  If you eat breakfast, please do so within one hour of getting up.   Each meal should contain half fruits/vegetables, one quarter protein, and one quarter carbs (no bigger than a computer mouse)  Cut down on sweet beverages. This includes juice, soda, and sweet tea.   Drink at least 1 glass of water with each meal and aim for at least 8 glasses per day  Exercise at least 150 minutes every week.

## 2024-03-03 NOTE — Assessment & Plan Note (Signed)
 A1c is stable at 5.0.  He is doing great with diet and exercise.  He is interested in increasing dose of Mounjaro to help with weight loss.  Will increase to 10 mg weekly.  He is aware of potential side effects.  He will come back in 3 months to recheck A1c.

## 2024-03-03 NOTE — Progress Notes (Signed)
   Cory Shaw is a 58 y.o. male who presents today for an office visit.  Assessment/Plan:  Chronic Problems Addressed Today: T2DM (type 2 diabetes mellitus) (HCC) A1c is stable at 5.0.  He is doing great with diet and exercise.  He is interested in increasing dose of Mounjaro to help with weight loss.  Will increase to 10 mg weekly.  He is aware of potential side effects.  He will come back in 3 months to recheck A1c.  Bilateral shoulder pain Overall symptoms are stable though still does have an occasional pain in his left shoulder.  He uses over-the-counter meds as needed.  We did discuss referral to PT however he declined for now.  He will let us know if his symptoms change or if he would like to have referral.  Left knee pain Following with orthopedics.  Diagnosed with meniscal tear a few months ago.  Overall symptoms are currently manageable.  As above we discussed referral to PT however he deferred for today.     Subjective:  HPI:  See A/P for status of chronic conditions.  Patient is here today for follow-up.  I last saw him about 4 months ago.  At that time he was doing well with [facility] and we increased the dose to 7.5 mg weekly.  He has done well with this.  Thus like his weight has plateaued.  He is still working on losing weight.  Working on diet and exercise.  Since our last visit he has had ongoing issues with left knee pain.  Saw orthopedics a couple of times for this was diagnosed with a meniscal tear.  Pain is currently manageable though does occasionally flareup.  He also has ongoing issues with pain in his left shoulder.  Was diagnosed with rotator cuff tendinopathy a few years ago.  He did have a respiratory infection a few months ago as well.  Was treated with antibiotics.  Symptoms have resolved.       Objective:  Physical Exam: BP 124/74   Pulse 66   Temp 98.2 F (36.8 C) (Temporal)   Ht 5\' 9"  (1.753 m)   Wt 198 lb 9.6 oz (90.1 kg)   SpO2 99%   BMI 29.33  kg/m   Wt Readings from Last 3 Encounters:  03/03/24 198 lb 9.6 oz (90.1 kg)  10/21/23 202 lb 6.4 oz (91.8 kg)  07/20/23 215 lb (97.5 kg)  Gen: No acute distress, resting comfortably CV: Regular rate and rhythm with no murmurs appreciated Pulm: Normal work of breathing, clear to auscultation bilaterally with no crackles, wheezes, or rhonchi Neuro: Grossly normal, moves all extremities Psych: Normal affect and thought content      Haedyn Ancrum M. Jimmey Ralph, MD 03/03/2024 11:02 AM

## 2024-03-03 NOTE — Assessment & Plan Note (Signed)
 Following with orthopedics.  Diagnosed with meniscal tear a few months ago.  Overall symptoms are currently manageable.  As above we discussed referral to PT however he deferred for today.

## 2024-03-03 NOTE — Addendum Note (Signed)
 Addended by: Dyann Kief on: 03/03/2024 12:34 PM   Modules accepted: Orders

## 2024-03-03 NOTE — Assessment & Plan Note (Signed)
 Overall symptoms are stable though still does have an occasional pain in his left shoulder.  He uses over-the-counter meds as needed.  We did discuss referral to PT however he declined for now.  He will let us know if his symptoms change or if he would like to have referral.

## 2024-03-04 ENCOUNTER — Other Ambulatory Visit

## 2024-03-04 DIAGNOSIS — E1169 Type 2 diabetes mellitus with other specified complication: Secondary | ICD-10-CM | POA: Diagnosis not present

## 2024-03-04 LAB — MICROALBUMIN / CREATININE URINE RATIO
Creatinine,U: 44.4 mg/dL
Microalb Creat Ratio: UNDETERMINED mg/g (ref 0.0–30.0)
Microalb, Ur: -0.1 mg/dL — ABNORMAL LOW (ref 0.0–1.9)

## 2024-03-08 ENCOUNTER — Encounter: Payer: Self-pay | Admitting: Family Medicine

## 2024-03-08 NOTE — Progress Notes (Signed)
 He has no significant amount of protein in his urine.

## 2024-05-25 ENCOUNTER — Other Ambulatory Visit (HOSPITAL_BASED_OUTPATIENT_CLINIC_OR_DEPARTMENT_OTHER): Payer: Self-pay

## 2024-05-25 ENCOUNTER — Other Ambulatory Visit: Payer: Self-pay | Admitting: Family Medicine

## 2024-05-25 MED ORDER — MOUNJARO 10 MG/0.5ML ~~LOC~~ SOAJ
10.0000 mg | SUBCUTANEOUS | 0 refills | Status: DC
  Filled 2024-05-25: qty 6, 84d supply, fill #0

## 2024-06-28 ENCOUNTER — Encounter: Payer: Self-pay | Admitting: Family Medicine

## 2024-06-28 NOTE — Telephone Encounter (Signed)
**Note De-identified  [facility] Obfuscation** Please advise 

## 2024-06-29 ENCOUNTER — Other Ambulatory Visit: Payer: Self-pay | Admitting: *Deleted

## 2024-06-29 ENCOUNTER — Other Ambulatory Visit (HOSPITAL_COMMUNITY): Payer: Self-pay

## 2024-06-29 MED ORDER — MOUNJARO 10 MG/0.5ML ~~LOC~~ SOAJ
10.0000 mg | SUBCUTANEOUS | 0 refills | Status: DC
  Filled 2024-06-29 – 2024-08-22 (×4): qty 6, 84d supply, fill #0

## 2024-06-29 NOTE — Telephone Encounter (Signed)
 Rx Mounjaro  send to [facility] pharmacy

## 2024-06-29 NOTE — Telephone Encounter (Signed)
 Please send a new prescription for Mounjaro .

## 2024-07-07 ENCOUNTER — Other Ambulatory Visit (HOSPITAL_COMMUNITY): Payer: Self-pay

## 2024-07-25 ENCOUNTER — Ambulatory Visit: Payer: Self-pay | Admitting: Family Medicine

## 2024-07-25 ENCOUNTER — Ambulatory Visit: Admitting: Family Medicine

## 2024-07-25 ENCOUNTER — Encounter: Payer: Self-pay | Admitting: Family Medicine

## 2024-07-25 VITALS — BP 125/81 | HR 62 | Temp 97.5°F | Ht 69.0 in | Wt 194.0 lb

## 2024-07-25 DIAGNOSIS — D696 Thrombocytopenia, unspecified: Secondary | ICD-10-CM

## 2024-07-25 DIAGNOSIS — Z7984 Long term (current) use of oral hypoglycemic drugs: Secondary | ICD-10-CM

## 2024-07-25 DIAGNOSIS — R7989 Other specified abnormal findings of blood chemistry: Secondary | ICD-10-CM

## 2024-07-25 DIAGNOSIS — E1169 Type 2 diabetes mellitus with other specified complication: Secondary | ICD-10-CM | POA: Diagnosis not present

## 2024-07-25 DIAGNOSIS — E785 Hyperlipidemia, unspecified: Secondary | ICD-10-CM

## 2024-07-25 DIAGNOSIS — Z0000 Encounter for general adult medical examination without abnormal findings: Secondary | ICD-10-CM

## 2024-07-25 DIAGNOSIS — Z0001 Encounter for general adult medical examination with abnormal findings: Secondary | ICD-10-CM

## 2024-07-25 LAB — COMPREHENSIVE METABOLIC PANEL WITH GFR
ALT: 25 U/L (ref 0–53)
AST: 22 U/L (ref 0–37)
Albumin: 4.5 g/dL (ref 3.5–5.2)
Alkaline Phosphatase: 34 U/L — ABNORMAL LOW (ref 39–117)
BUN: 17 mg/dL (ref 6–23)
CO2: 28 meq/L (ref 19–32)
Calcium: 9 mg/dL (ref 8.4–10.5)
Chloride: 103 meq/L (ref 96–112)
Creatinine, Ser: 0.95 mg/dL (ref 0.40–1.50)
GFR: 89.49 mL/min (ref >60.00)
Glucose, Bld: 91 mg/dL (ref 70–99)
Potassium: 4.3 meq/L (ref 3.5–5.1)
Sodium: 139 meq/L (ref 135–145)
Total Bilirubin: 0.9 mg/dL (ref 0.2–1.2)
Total Protein: 6.8 g/dL (ref 6.0–8.3)

## 2024-07-25 LAB — HEMOGLOBIN A1C: Hgb A1c MFr Bld: 5.2 % (ref 4.6–6.5)

## 2024-07-25 LAB — CBC
HCT: 42.1 % (ref 39.0–52.0)
Hemoglobin: 14.6 g/dL (ref 13.0–17.0)
MCHC: 34.6 g/dL (ref 30.0–36.0)
MCV: 90.8 fl (ref 78.0–100.0)
Platelets: 128 K/uL — ABNORMAL LOW (ref 150.0–400.0)
RBC: 4.64 Mil/uL (ref 4.22–5.81)
RDW: 13.3 % (ref 11.5–15.5)
WBC: 4.1 K/uL (ref 4.0–10.5)

## 2024-07-25 LAB — LIPID PANEL
Cholesterol: 194 mg/dL (ref 0–200)
HDL: 48.6 mg/dL (ref >39.00)
LDL Cholesterol: 120 mg/dL — ABNORMAL HIGH (ref 0–99)
NonHDL: 144.95
Total CHOL/HDL Ratio: 4
Triglycerides: 126 mg/dL (ref 0.0–149.0)
VLDL: 25.2 mg/dL (ref 0.0–40.0)

## 2024-07-25 LAB — TESTOSTERONE: Testosterone: 348.43 ng/dL (ref 300.00–890.00)

## 2024-07-25 LAB — TSH: TSH: 1.03 u[IU]/mL (ref 0.35–5.50)

## 2024-07-25 NOTE — Assessment & Plan Note (Signed)
 Last A1c was stable at 5.0.  He is doing a great job with diet and exercise.  On Mounjaro  10 mg weekly.  Having some issues with constipation with this.  We did discuss trial of MiraLAX daily and stool softeners.  He is not interested in increasing the dose at this point.  May consider decreasing back to 7.5 depending on results of today's A1c.

## 2024-07-25 NOTE — Assessment & Plan Note (Signed)
 Check lipids

## 2024-07-25 NOTE — Assessment & Plan Note (Signed)
 Check CBC

## 2024-07-25 NOTE — Assessment & Plan Note (Signed)
 Had lengthy discussion with patient today regarding his recent low testosterone  readings.  Previously has seen online physician for this and was started on Clomid.  He has had low testosterone  levels documented around 200 previously though these results are not available for me to review.  He does have associated symptoms including increasing fatigue and erectile dysfunction.  We will check testosterone  level today.  If low will consider transdermal replacement options.

## 2024-07-25 NOTE — Progress Notes (Signed)
 Chief Complaint:  Cory Shaw is a 58 y.o. male who presents today for his annual comprehensive physical exam.    Assessment/Plan:  Chronic Problems Addressed Today: T2DM (type 2 diabetes mellitus) (HCC) Last A1c was stable at 5.0.  He is doing a great job with diet and exercise.  On Mounjaro  10 mg weekly.  Having some issues with constipation with this.  We did discuss trial of MiraLAX daily and stool softeners.  He is not interested in increasing the dose at this point.  May consider decreasing back to 7.5 depending on results of today's A1c.  Thrombocytopenia (HCC) Check CBC.  Dyslipidemia associated with type 2 diabetes mellitus (HCC) Check lipids.   Low testosterone  Had lengthy discussion with patient today regarding his recent low testosterone  readings.  Previously has seen online physician for this and was started on Clomid.  He has had low testosterone  levels documented around 200 previously though these results are not available for me to review.  He does have associated symptoms including increasing fatigue and erectile dysfunction.  We will check testosterone  level today.  If low will consider transdermal replacement options.  Preventative Healthcare: Check labs.  Up-to-date on vaccines and screenings.  Patient Counseling(The following topics were reviewed and/or handout was given):  -Nutrition: Stressed importance of moderation in sodium/caffeine intake, saturated fat and cholesterol, caloric balance, sufficient intake of fresh fruits, vegetables, and fiber.  -Stressed the importance of regular exercise.   -Substance Abuse: Discussed cessation/primary prevention of tobacco, alcohol, or other drug use; driving or other dangerous activities under the influence; availability of treatment for abuse.   -Injury prevention: Discussed safety belts, safety helmets, smoke detector, smoking near bedding or upholstery.   -Sexuality: Discussed sexually transmitted diseases, partner  selection, use of condoms, avoidance of unintended pregnancy and contraceptive alternatives.   -Dental health: Discussed importance of regular tooth brushing, flossing, and dental visits.  -Health maintenance and immunizations reviewed. Please refer to Health maintenance section.  Return to care in 1 year for next preventative visit.     Subjective:  HPI:  He has no acute complaints today. Patient is here today for his annual physical.  See assessment / plan for status of chronic conditions.  Discussed the use of AI scribe software for clinical note transcription with the patient, who gave verbal consent to proceed.  History of Present Illness Cory Shaw is a 58 year old male who presents for an annual physical exam and evaluation of low testosterone  symptoms.  He underwent an online testosterone  test last year, which indicated low testosterone  levels around 200, whereas the normal range for his age should be around 500. He did not start the prescribed Clomid or any other treatment at that time. Over the past six months, he has noticed a decline in workout performance and increased fatigue, requiring naps during the day. He is not currently on any testosterone  replacement therapy. He also experiences erectile dysfunction symptoms. He is interested in checking his current testosterone  levels to assess the situation further.  He reports constipation issues. He has tried increasing fiber and hydration, and has used Miralax intermittently without significant relief. He has not tried stool softeners yet.   Lifestyle Diet: Balanced.  Plenty of fruits and vegetables cutting down snacking. Exercise: Routinely.      07/25/2024    7:25 AM  Depression screen PHQ 2/9  Decreased Interest 0  Down, Depressed, Hopeless 0  PHQ - 2 Score 0    Health Maintenance Due  Topic Date Due   FOOT EXAM  05/31/2020   Diabetic kidney evaluation - eGFR measurement  07/15/2024     ROS: Per HPI,  otherwise a complete review of systems was negative.   PMH:  The following were reviewed and entered/updated in epic: Past Medical History:  Diagnosis Date   Allergy     Pneumonia 2010   PONV (postoperative nausea and vomiting)    No amnesia causing drugs   Seasonal allergies    Patient Active Problem List   Diagnosis Date Noted   Low testosterone  07/25/2024   Left knee pain 03/03/2024   Obesity 10/21/2023   Dyslipidemia associated with type 2 diabetes mellitus (HCC) 06/03/2019   T2DM (type 2 diabetes mellitus) (HCC) 06/03/2019   Eczema 10/25/2018   Bilateral shoulder pain 05/21/2018   Thrombocytopenia (HCC) 10/15/2017   Abnormal transaminases 10/15/2017   Accessory bone of foot 08/27/2017   Chronic rhinitis 07/23/2012   Chronic respiratory failure (HCC) 07/23/2012   Pulmonary infiltrates 06/22/2012   Past Surgical History:  Procedure Laterality Date   HERNIA REPAIR     transpositon of ulna nerve Left 2009    Family History  Problem Relation Age of Onset   Other Brother        NETS   Diabetes Mother    Pneumonia Mother    Atrial fibrillation Mother    Heart attack Maternal Grandfather    Prostate cancer Neg Hx     Medications- reviewed and updated Current Outpatient Medications  Medication Sig Dispense Refill   fluticasone (FLONASE) 50 MCG/ACT nasal spray Place into both nostrils daily.     meloxicam  (MOBIC ) 15 MG tablet Take 1 tablet (15 mg total) by mouth daily. 14 tablet 0   Multiple Vitamin (MULTIVITAMIN) capsule Take 1 capsule by mouth daily.     tirzepatide  (MOUNJARO ) 10 MG/0.5ML Pen Inject 10 mg into the skin once a week. 6 mL 0   No current facility-administered medications for this visit.    Allergies-reviewed and updated Allergies  Allergen Reactions   Sulfa Antibiotics Hives    Social History   Socioeconomic History   Marital status: Married    Spouse name: Not on file   Number of children: Not on file   Years of education: Not on file    Highest education level: Associate degree: occupational, Scientist, product/process development, or vocational program  Occupational History   Occupation: Stage manager for [employer]     Employer: [employer]  Tobacco Use   Smoking status: Never   Smokeless tobacco: Never  Vaping Use   Vaping status: Never Used  Substance and Sexual Activity   Alcohol use: Yes    Alcohol/week: 1.0 standard drink of alcohol    Types: 1 Cans of beer per week   Drug use: No   Sexual activity: Yes  Other Topics Concern   Not on file  Social History Narrative   Not on file   Social Drivers of Health   Financial Resource Strain: Low Risk  (07/22/2024)   Overall Financial Resource Strain (CARDIA)    Difficulty of Paying Living Expenses: Not hard at all  Food Insecurity: No Food Insecurity (07/22/2024)   Hunger Vital Sign    Worried About Running Out of Food in the Last Year: Never true    Ran Out of Food in the Last Year: Never true  Transportation Needs: No Transportation Needs (07/22/2024)   PRAPARE - Administrator, Civil Service (Medical): No    Lack of Transportation (  Non-Medical): No  Physical Activity: Sufficiently Active (07/22/2024)   Exercise Vital Sign    Days of Exercise per Week: 5 days    Minutes of Exercise per Session: 60 min  Stress: No Stress Concern Present (07/22/2024)   [employer] of Occupational Health - Occupational Stress Questionnaire    Feeling of Stress: Not at all  Social Connections: Moderately Integrated (07/22/2024)   Social Connection and Isolation Panel    Frequency of Communication with Friends and Family: More than three times a week    Frequency of Social Gatherings with Friends and Family: Once a week    Attends Religious Services: Never    Database administrator or Organizations: Yes    Attends Engineer, structural: More than 4 times per year    Marital Status: Married        Objective:  Physical Exam: BP 125/81   Pulse 62   Temp (!) 97.5 F (36.4 C) (Temporal)    Ht 5' 9 (1.753 m)   Wt 194 lb (88 kg)   SpO2 99%   BMI 28.65 kg/m   Body mass index is 28.65 kg/m. Wt Readings from Last 3 Encounters:  07/25/24 194 lb (88 kg)  03/03/24 198 lb 9.6 oz (90.1 kg)  10/21/23 202 lb 6.4 oz (91.8 kg)   Gen: NAD, resting comfortably HEENT: TMs normal bilaterally. OP clear. No thyromegaly noted.  CV: RRR with no murmurs appreciated Pulm: NWOB, CTAB with no crackles, wheezes, or rhonchi GI: Normal bowel sounds present. Soft, Nontender, Nondistended. MSK: no edema, cyanosis, or clubbing noted Skin: warm, dry Neuro: CN2-12 grossly intact. Strength 5/5 in upper and lower extremities. Reflexes symmetric and intact bilaterally.  Psych: Normal affect and thought content     Lindsay Soulliere M. Kennyth, MD 07/25/2024 7:54 AM

## 2024-07-25 NOTE — Patient Instructions (Signed)
 It was very nice to see you today!  VISIT SUMMARY: Today, you had your annual physical exam and discussed several health concerns including low testosterone  symptoms, weight management, and constipation.  YOUR PLAN: SUSPECTED HYPOGONADISM (LOW TESTOSTERONE ): You have reported low testosterone  levels and symptoms such as decreased workout performance, fatigue, and erectile dysfunction. -We will order a testosterone  level test to assess your current levels. -We discussed potential testosterone  replacement therapy options and their side effects.  T2DM: You have experienced a plateau in your weight loss at 194 lbs and are currently on Mounjaro  for T2DM. -We will order an A1c test to evaluate your blood sugar levels. -We discussed the current Mounjaro  dosage and will consider adjusting it based on your A1c results.  CONSTIPATION: You have been experiencing chronic constipation with minimal relief from Miralax. -We recommend continuing daily use of Miralax. -You should also try a stool softener such as Colace.  Return in about 1 year (around 07/25/2025) for Annual Physical.   Take care, Dr Kennyth  PLEASE NOTE:  If you had any lab tests, please let us  know if you have not heard back within a few days. You may see your results on mychart before we have a chance to review them but we will give you a call once they are reviewed by us .   If we ordered any referrals today, please let us  know if you have not heard from their office within the next week.   If you had any urgent prescriptions sent in today, please check with the pharmacy within an hour of our visit to make sure the prescription was transmitted appropriately.   Please try these tips to maintain a healthy lifestyle:  Eat at least 3 REAL meals and 1-2 snacks per day.  Aim for no more than 5 hours between eating.  If you eat breakfast, please do so within one hour of getting up.   Each meal should contain half fruits/vegetables, one  quarter protein, and one quarter carbs (no bigger than a computer mouse)  Cut down on sweet beverages. This includes juice, soda, and sweet tea.   Drink at least 1 glass of water with each meal and aim for at least 8 glasses per day  Exercise at least 150 minutes every week.    Preventive Care 56-34 Years Old, Male Preventive care refers to lifestyle choices and visits with your health care provider that can promote health and wellness. Preventive care visits are also called wellness exams. What can I expect for my preventive care visit? Counseling During your preventive care visit, your health care provider may ask about your: Medical history, including: Past medical problems. Family medical history. Current health, including: Emotional well-being. Home life and relationship well-being. Sexual activity. Lifestyle, including: Alcohol, nicotine or tobacco, and drug use. Access to firearms. Diet, exercise, and sleep habits. Safety issues such as seatbelt and bike helmet use. Sunscreen use. Work and work Astronomer. Physical exam Your health care provider will check your: Height and weight. These may be used to calculate your BMI (body mass index). BMI is a measurement that tells if you are at a healthy weight. Waist circumference. This measures the distance around your waistline. This measurement also tells if you are at a healthy weight and may help predict your risk of certain diseases, such as type 2 diabetes and high blood pressure. Heart rate and blood pressure. Body temperature. Skin for abnormal spots. What immunizations do I need?  Vaccines are usually given at various  ages, according to a schedule. Your health care provider will recommend vaccines for you based on your age, medical history, and lifestyle or other factors, such as travel or where you work. What tests do I need? Screening Your health care provider may recommend screening tests for certain conditions. This  may include: Lipid and cholesterol levels. Diabetes screening. This is done by checking your blood sugar (glucose) after you have not eaten for a while (fasting). Hepatitis B test. Hepatitis C test. HIV (human immunodeficiency virus) test. STI (sexually transmitted infection) testing, if you are at risk. Lung cancer screening. Prostate cancer screening. Colorectal cancer screening. Talk with your health care provider about your test results, treatment options, and if necessary, the need for more tests. Follow these instructions at home: Eating and drinking  Eat a diet that includes fresh fruits and vegetables, whole grains, lean protein, and low-fat dairy products. Take vitamin and mineral supplements as recommended by your health care provider. Do not drink alcohol if your health care provider tells you not to drink. If you drink alcohol: Limit how much you have to 0-2 drinks a day. Know how much alcohol is in your drink. In the [place] one drink equals one 12 oz bottle of beer (355 mL), one 5 oz glass of wine (148 mL), or one 1 oz glass of hard liquor (44 mL). Lifestyle Brush your teeth every morning and night with fluoride toothpaste. Floss one time each day. Exercise for at least 30 minutes 5 or more days each week. Do not use any products that contain nicotine or tobacco. These products include cigarettes, chewing tobacco, and vaping devices, such as e-cigarettes. If you need help quitting, ask your health care provider. Do not use drugs. If you are sexually active, practice safe sex. Use a condom or other form of protection to prevent STIs. Take aspirin only as told by your health care provider. Make sure that you understand how much to take and what form to take. Work with your health care provider to find out whether it is safe and beneficial for you to take aspirin daily. Find healthy ways to manage stress, such as: Meditation, yoga, or listening to music. Journaling. Talking to  a trusted person. Spending time with friends and family. Minimize exposure to UV radiation to reduce your risk of skin cancer. Safety Always wear your seat belt while driving or riding in a vehicle. Do not drive: If you have been drinking alcohol. Do not ride with someone who has been drinking. When you are tired or distracted. While texting. If you have been using any mind-altering substances or drugs. Wear a helmet and other protective equipment during sports activities. If you have firearms in your house, make sure you follow all gun safety procedures. What's next? Go to your health care provider once a year for an annual wellness visit. Ask your health care provider how often you should have your eyes and teeth checked. Stay up to date on all vaccines. This information is not intended to replace advice given to you by your health care provider. Make sure you discuss any questions you have with your health care provider. Document Revised: 05/22/2021 Document Reviewed: 05/22/2021 Elsevier Patient Education  2024 [employer].

## 2024-07-25 NOTE — Progress Notes (Signed)
 His LDL cholesterol is 120.  We should try to get this a little bit lower.  He may benefit from starting statin to improve his numbers and low risk heart attack and stroke.  Please send in Lipitor 10 mg daily if he is agreeable to start.  A1c very well-controlled at 5.2.  We can recheck again in 6 to 12 months.  Testosterone  is within normal range.  Insurance will not pay for replacement at this level.  He can come back in a few weeks to recheck if he wishes or we can recheck again next year.

## 2024-08-03 ENCOUNTER — Other Ambulatory Visit (HOSPITAL_BASED_OUTPATIENT_CLINIC_OR_DEPARTMENT_OTHER): Payer: Self-pay

## 2024-08-03 ENCOUNTER — Other Ambulatory Visit: Payer: Self-pay | Admitting: *Deleted

## 2024-08-03 MED ORDER — ATORVASTATIN CALCIUM 10 MG PO TABS
10.0000 mg | ORAL_TABLET | Freq: Every day | ORAL | 3 refills | Status: AC
Start: 2024-08-03 — End: ?
  Filled 2024-08-03: qty 30, 30d supply, fill #0
  Filled 2024-09-14: qty 30, 30d supply, fill #1
  Filled 2024-11-21: qty 30, 30d supply, fill #2

## 2024-08-03 NOTE — Telephone Encounter (Signed)
Rx send to [facility]

## 2024-08-06 ENCOUNTER — Other Ambulatory Visit (HOSPITAL_BASED_OUTPATIENT_CLINIC_OR_DEPARTMENT_OTHER): Payer: Self-pay

## 2024-08-14 ENCOUNTER — Encounter (HOSPITAL_COMMUNITY): Payer: Self-pay

## 2024-08-14 ENCOUNTER — Other Ambulatory Visit (HOSPITAL_COMMUNITY): Payer: Self-pay

## 2024-08-16 ENCOUNTER — Telehealth: Payer: Self-pay

## 2024-08-16 ENCOUNTER — Other Ambulatory Visit (HOSPITAL_COMMUNITY): Payer: Self-pay

## 2024-08-16 NOTE — Telephone Encounter (Signed)
 Pharmacy Patient Advocate Encounter   Received notification from Onbase that prior authorization for Mounjaro  10MG /0.5ML auto-injectors is required/requested.   Insurance verification completed.   The patient is insured through [employer] .   Per test claim: PA required; PA submitted to above mentioned insurance via Latent Key/confirmation #/EOC [id number] Status is pending

## 2024-08-17 ENCOUNTER — Other Ambulatory Visit (HOSPITAL_COMMUNITY): Payer: Self-pay

## 2024-08-17 ENCOUNTER — Other Ambulatory Visit (HOSPITAL_BASED_OUTPATIENT_CLINIC_OR_DEPARTMENT_OTHER): Payer: Self-pay

## 2024-08-22 ENCOUNTER — Other Ambulatory Visit (HOSPITAL_COMMUNITY): Payer: Self-pay

## 2024-08-22 NOTE — Telephone Encounter (Signed)
 LVM  Mounjaro  10MG /0.5ML auto-injectors  has been APPROVED from 08/16/24 to 08/20/25

## 2024-08-22 NOTE — Telephone Encounter (Signed)
 Pharmacy Patient Advocate Encounter  Received notification from [employer] that Prior Authorization for Mounjaro  10MG /0.5ML auto-injectors  has been APPROVED from 08/16/24 to 08/20/25   PA #/Case ID/Reference #: [id number]

## 2024-08-24 ENCOUNTER — Other Ambulatory Visit (HOSPITAL_BASED_OUTPATIENT_CLINIC_OR_DEPARTMENT_OTHER): Payer: Self-pay

## 2024-08-24 ENCOUNTER — Other Ambulatory Visit (HOSPITAL_COMMUNITY): Payer: Self-pay

## 2024-09-01 LAB — HM DIABETES EYE EXAM

## 2024-09-14 ENCOUNTER — Telehealth: Admitting: Physician Assistant

## 2024-09-14 ENCOUNTER — Other Ambulatory Visit (HOSPITAL_BASED_OUTPATIENT_CLINIC_OR_DEPARTMENT_OTHER): Payer: Self-pay

## 2024-09-14 ENCOUNTER — Other Ambulatory Visit: Payer: Self-pay

## 2024-09-14 DIAGNOSIS — B9689 Other specified bacterial agents as the cause of diseases classified elsewhere: Secondary | ICD-10-CM

## 2024-09-14 DIAGNOSIS — J208 Acute bronchitis due to other specified organisms: Secondary | ICD-10-CM | POA: Diagnosis not present

## 2024-09-14 MED ORDER — BENZONATATE 100 MG PO CAPS
100.0000 mg | ORAL_CAPSULE | Freq: Three times a day (TID) | ORAL | 0 refills | Status: AC | PRN
  Filled 2024-09-14: qty 30, 10d supply, fill #0

## 2024-09-14 MED ORDER — DOXYCYCLINE HYCLATE 100 MG PO TABS
100.0000 mg | ORAL_TABLET | Freq: Two times a day (BID) | ORAL | 0 refills | Status: AC
  Filled 2024-09-14: qty 14, 7d supply, fill #0

## 2024-09-14 NOTE — Progress Notes (Signed)
 Virtual Visit Consent   Cory Shaw, you are scheduled for a virtual visit with a [facility] provider today. Just as with appointments in the office, your consent must be obtained to participate. Your consent will be active for this visit and any virtual visit you may have with one of our providers in the next 365 days. If you have a MyChart account, a copy of this consent can be sent to you electronically.  As this is a virtual visit, video technology does not allow for your provider to perform a traditional examination. This may limit your provider's ability to fully assess your condition. If your provider identifies any concerns that need to be evaluated in person or the need to arrange testing (such as labs, EKG, etc.), we will make arrangements to do so. Although advances in technology are sophisticated, we cannot ensure that it will always work on either your end or our end. If the connection with a video visit is poor, the visit may have to be switched to a telephone visit. With either a video or telephone visit, we are not always able to ensure that we have a secure connection.  By engaging in this virtual visit, you consent to the provision of healthcare and authorize for your insurance to be billed (if applicable) for the services provided during this visit. Depending on your insurance coverage, you may receive a charge related to this service.  I need to obtain your verbal consent now. Are you willing to proceed with your visit today? Cory Shaw has provided verbal consent on 09/14/2024 for a virtual visit (video or telephone). Cory Shaw, [place]  Date: 09/14/2024 9:12 AM   Virtual Visit via Video Note   I, Cory Shaw, connected with  Cory Shaw  ([id number], 1965/12/30) on 09/14/24 at  9:15 AM EDT by a video-enabled telemedicine application and verified that I am speaking with the correct person using two identifiers.  Location: Patient: Virtual Visit Location  Patient: Home Provider: Virtual Visit Location Provider: Home Office   I discussed the limitations of evaluation and management by telemedicine and the availability of in person appointments. The patient expressed understanding and agreed to proceed.    History of Present Illness: Cory Shaw is a 58 y.o. who identifies as a male who was assigned male at birth, and is being seen today for some persistent URI symptoms. Notes dealing with influenza about 10-12 days ago having a few miserable days before symptoms began improiving. Continue to improve until end of last week with increased chest congestion with cough becoming productive of thick and dark green phlegm. Occasional borderline fever. Denies chest pain or SOB.  OTC -- Ibuprofen , Flonase nasal spray, Tylenol  Flu.   HPI: HPI  Problems:  Patient Active Problem List   Diagnosis Date Noted   Low testosterone  07/25/2024   Left knee pain 03/03/2024   Obesity 10/21/2023   Dyslipidemia associated with type 2 diabetes mellitus (HCC) 06/03/2019   T2DM (type 2 diabetes mellitus) (HCC) 06/03/2019   Eczema 10/25/2018   Bilateral shoulder pain 05/21/2018   Thrombocytopenia 10/15/2017   Abnormal transaminases 10/15/2017   Accessory bone of foot 08/27/2017   Chronic rhinitis 07/23/2012   Chronic respiratory failure (HCC) 07/23/2012   Pulmonary infiltrates 06/22/2012    Allergies:  Allergies  Allergen Reactions   Sulfa Antibiotics Hives   Medications:  Current Outpatient Medications:    benzonatate (TESSALON) 100 MG capsule, Take 1 capsule (100 mg total) by mouth  3 (three) times daily as needed for cough., Disp: 30 capsule, Rfl: 0   doxycycline  (VIBRA -TABS) 100 MG tablet, Take 1 tablet (100 mg total) by mouth 2 (two) times daily., Disp: 14 tablet, Rfl: 0   atorvastatin  (LIPITOR) 10 MG tablet, Take 1 tablet (10 mg total) by mouth daily., Disp: 30 tablet, Rfl: 3   fluticasone (FLONASE) 50 MCG/ACT nasal spray, Place into both nostrils  daily., Disp: , Rfl:    Multiple Vitamin (MULTIVITAMIN) capsule, Take 1 capsule by mouth daily., Disp: , Rfl:    tirzepatide  (MOUNJARO ) 10 MG/0.5ML Pen, Inject 10 mg into the skin once a week., Disp: 6 mL, Rfl: 0  Observations/Objective: Patient is well-developed, well-nourished in no acute distress.  Resting comfortably at home.  Head is normocephalic, atraumatic.  No labored breathing. Speech is clear and coherent with logical content.  Patient is alert and oriented at baseline.   Assessment and Plan: 1. Acute bacterial bronchitis (Primary) - benzonatate (TESSALON) 100 MG capsule; Take 1 capsule (100 mg total) by mouth 3 (three) times daily as needed for cough.  Dispense: 30 capsule; Refill: 0 - doxycycline  (VIBRA -TABS) 100 MG tablet; Take 1 tablet (100 mg total) by mouth 2 (two) times daily.  Dispense: 14 tablet; Refill: 0  Rx Doxycycline .  Increase fluids.  Rest.  Saline nasal spray.  Probiotic.  Mucinex as directed.  Humidifier in bedroom. Tessalon per orders.  Call or return to clinic if symptoms are not improving.   Follow Up Instructions: I discussed the assessment and treatment plan with the patient. The patient was provided an opportunity to ask questions and all were answered. The patient agreed with the plan and demonstrated an understanding of the instructions.  A copy of instructions were sent to the patient via MyChart unless otherwise noted below.   The patient was advised to call back or seek an in-person evaluation if the symptoms worsen or if the condition fails to improve as anticipated.    Cory Velma Lunger, PA-C

## 2024-09-14 NOTE — Patient Instructions (Signed)
 Cory Shaw, thank you for joining [name], PA-C for today's virtual visit.  While this provider is not your primary care provider (PCP), if your PCP is located in our provider database this encounter information will be shared with them immediately following your visit.   A [facility] MyChart account gives you access to today's visit and all your visits, tests, and labs performed at [facility]  click here if you don't have a  [facility] MyChart account or go to [email or web address]  Consent: (Patient) Cory Shaw provided verbal consent for this virtual visit at the beginning of the encounter.  Current Medications:  Current Outpatient Medications:    benzonatate (TESSALON) 100 MG capsule, Take 1 capsule (100 mg total) by mouth 3 (three) times daily as needed for cough., Disp: 30 capsule, Rfl: 0   doxycycline  (VIBRA -TABS) 100 MG tablet, Take 1 tablet (100 mg total) by mouth 2 (two) times daily., Disp: 14 tablet, Rfl: 0   atorvastatin  (LIPITOR) 10 MG tablet, Take 1 tablet (10 mg total) by mouth daily., Disp: 30 tablet, Rfl: 3   fluticasone (FLONASE) 50 MCG/ACT nasal spray, Place into both nostrils daily., Disp: , Rfl:    Multiple Vitamin (MULTIVITAMIN) capsule, Take 1 capsule by mouth daily., Disp: , Rfl:    tirzepatide  (MOUNJARO ) 10 MG/0.5ML Pen, Inject 10 mg into the skin once a week., Disp: 6 mL, Rfl: 0   Medications ordered in this encounter:  Meds ordered this encounter  Medications   benzonatate (TESSALON) 100 MG capsule    Sig: Take 1 capsule (100 mg total) by mouth 3 (three) times daily as needed for cough.    Dispense:  30 capsule    Refill:  0    Supervising Provider:   BLAISE ALEENE KIDD [[id number]]   doxycycline  (VIBRA -TABS) 100 MG tablet    Sig: Take 1 tablet (100 mg total) by mouth 2 (two) times daily.    Dispense:  14 tablet    Refill:  0    Supervising Provider:   BLAISE ALEENE KIDD [[id number]]     *If you need refills on other  medications prior to your next appointment, please contact your pharmacy*  Follow-Up: Call back or seek an in-person evaluation if the symptoms worsen or if the condition fails to improve as anticipated.  [facility] [phone]  Other Instructions Take antibiotic (Doxycycline ) as directed.  Increase fluids.  Get plenty of rest. Use Mucinex for congestion. Tessalon for cough as directed. Take a daily probiotic (I recommend Align or Culturelle, but even Activia Yogurt may be beneficial).  A humidifier placed in the bedroom may offer some relief for a dry, scratchy throat of nasal irritation.  Read information below on acute bronchitis. Please call or return to clinic if symptoms are not improving.  Acute Bronchitis Bronchitis is when the airways that extend from the windpipe into the lungs get red, puffy, and painful (inflamed). Bronchitis often causes thick spit (mucus) to develop. This leads to a cough. A cough is the most common symptom of bronchitis. In acute bronchitis, the condition usually begins suddenly and goes away over time (usually in 2 weeks). Smoking, allergies, and asthma can make bronchitis worse. Repeated episodes of bronchitis may cause more lung problems.  HOME CARE Rest. Drink enough fluids to keep your pee (urine) clear or pale yellow (unless you need to limit fluids as told by your doctor). Only take over-the-counter or prescription medicines as told by your doctor. Avoid smoking and  secondhand smoke. These can make bronchitis worse. If you are a smoker, think about using nicotine gum or skin patches. Quitting smoking will help your lungs heal faster. Reduce the chance of getting bronchitis again by: Washing your hands often. Avoiding people with cold symptoms. Trying not to touch your hands to your mouth, nose, or eyes. Follow up with your doctor as told.  GET HELP IF: Your symptoms do not improve after 1 week of treatment. Symptoms  include: Cough. Fever. Coughing up thick spit. Body aches. Chest congestion. Chills. Shortness of breath. Sore throat.  GET HELP RIGHT AWAY IF:  You have an increased fever. You have chills. You have severe shortness of breath. You have bloody thick spit (sputum). You throw up (vomit) often. You lose too much body fluid (dehydration). You have a severe headache. You faint.  MAKE SURE YOU:  Understand these instructions. Will watch your condition. Will get help right away if you are not doing well or get worse. Document Released: 05/12/2008 Document Revised: 07/27/2013 Document Reviewed: 05/17/2013 [facility] Patient Information 2015 [place], [place]. This information is not intended to replace advice given to you by your health care provider. Make sure you discuss any questions you have with your health care provider.    If you have been instructed to have an in-person evaluation today at a local Urgent Care facility, please use the link below. It will take you to a list of all of our available [facility], including address, phone number and hours of operation. Please do not delay care.  [facility]  If you or a family member do not have a primary care provider, use the link below to schedule a visit and establish care. When you choose a [facility] primary care physician or advanced practice provider, you gain a long-term partner in health. Find a Primary Care Provider  Learn more about [facility]'s in-office and virtual care options: [facility] - Get Care Now

## 2024-10-12 ENCOUNTER — Encounter: Payer: Self-pay | Admitting: Family Medicine

## 2024-11-11 ENCOUNTER — Other Ambulatory Visit (HOSPITAL_COMMUNITY): Payer: Self-pay

## 2024-11-21 ENCOUNTER — Other Ambulatory Visit (HOSPITAL_BASED_OUTPATIENT_CLINIC_OR_DEPARTMENT_OTHER): Payer: Self-pay

## 2024-11-21 ENCOUNTER — Other Ambulatory Visit: Payer: Self-pay | Admitting: Family Medicine

## 2024-11-21 ENCOUNTER — Other Ambulatory Visit: Payer: Self-pay

## 2024-11-21 MED ORDER — MOUNJARO 10 MG/0.5ML ~~LOC~~ SOAJ
10.0000 mg | SUBCUTANEOUS | 0 refills | Status: AC
  Filled 2024-11-21: qty 6, 84d supply, fill #0

## 2024-11-25 ENCOUNTER — Telehealth: Admitting: Family Medicine

## 2024-11-25 ENCOUNTER — Other Ambulatory Visit (HOSPITAL_BASED_OUTPATIENT_CLINIC_OR_DEPARTMENT_OTHER): Payer: Self-pay

## 2024-11-25 DIAGNOSIS — E1169 Type 2 diabetes mellitus with other specified complication: Secondary | ICD-10-CM | POA: Diagnosis not present

## 2024-11-25 DIAGNOSIS — L309 Dermatitis, unspecified: Secondary | ICD-10-CM | POA: Diagnosis not present

## 2024-11-25 DIAGNOSIS — Z7985 Long-term (current) use of injectable non-insulin antidiabetic drugs: Secondary | ICD-10-CM | POA: Diagnosis not present

## 2024-11-25 MED ORDER — PREDNISONE 10 MG PO TABS
ORAL_TABLET | ORAL | 0 refills | Status: AC
  Filled 2024-11-25: qty 28, 21d supply, fill #0

## 2024-11-25 NOTE — Assessment & Plan Note (Signed)
 Most recent A1c 5.2 on Mounjaro  10 mg weekly.  Do not think he will have any issues with above prednisone  burst.

## 2024-11-25 NOTE — Assessment & Plan Note (Signed)
 Patient with severe eczema flare over the last several days.  He last had a flare of this severe about 5 or 6 years ago that was treated with prednisone .  Discussed limitations of virtual visit however would be reasonable for us  to give another round of prednisone  at this point.  He has previously tolerated the lower dose well.  Will send in 20 mg daily for 1 week to be followed by 10 mg daily for the next 2 weeks.  We did briefly discuss long-term management for his eczema however given that symptoms are typically well-controlled, do not think he needs to be on maintenance medication at this point however we can readdress at some point in the future if symptoms change.  He will let us  know if he changes his mind or if he would like to be referred to see dermatologist.

## 2024-11-25 NOTE — Progress Notes (Signed)
" ° °  Cory Shaw is a 58 y.o. male who presents today for a virtual office visit.  Assessment/Plan:  Chronic Problems Addressed Today: Eczema Patient with severe eczema flare over the last several days.  He last had a flare of this severe about 5 or 6 years ago that was treated with prednisone .  Discussed limitations of virtual visit however would be reasonable for us  to give another round of prednisone  at this point.  He has previously tolerated the lower dose well.  Will send in 20 mg daily for 1 week to be followed by 10 mg daily for the next 2 weeks.  We did briefly discuss long-term management for his eczema however given that symptoms are typically well-controlled, do not think he needs to be on maintenance medication at this point however we can readdress at some point in the future if symptoms change.  He will let us  know if he changes his mind or if he would like to be referred to see dermatologist.  T2DM (type 2 diabetes mellitus) (HCC) Most recent A1c 5.2 on Mounjaro  10 mg weekly.  Do not think he will have any issues with above prednisone  burst.     Subjective:  HPI:  See Assessment / plan for status of chronic conditions.  Patient here today with eczema flare.  He has chronic history of eczema for most of his life usually very manageable though does occasionally get severe outbreaks.  This last happened 6 years ago.  Has tried using hydrocortisone cream with modest improvement though he has difficulty getting on his back and other hard to reach places.  He has also noticed increased joint pain recently as well.         Objective/Observations  Physical Exam: Gen: NAD, resting comfortably Pulm: Normal work of breathing Neuro: Grossly normal, moves all extremities Psych: Normal affect and thought content  Virtual Visit via Video   I connected with Cory Shaw on 11/25/2024 at  7:40 AM EST by a video enabled telemedicine application and verified that I am speaking with the  correct person using two identifiers. The limitations of evaluation and management by telemedicine and the availability of in person appointments were discussed. The patient expressed understanding and agreed to proceed.   Patient location: Home Provider location: [place] [facility] [employer] Persons participating in the virtual visit: Myself and Patient     [employer] HER[employer]. Kennyth, MD 11/25/2024 7:56 AM  "

## 2024-11-28 ENCOUNTER — Telehealth: Admitting: Family Medicine

## 2024-11-28 DIAGNOSIS — R197 Diarrhea, unspecified: Secondary | ICD-10-CM

## 2024-11-28 NOTE — Progress Notes (Signed)
 Pt is concerned he has c-diff from his dog with recurrent diarrhea. He is advised to go to [facility] for testing and treatment. He agrees. DWB

## 2024-12-06 ENCOUNTER — Other Ambulatory Visit (HOSPITAL_BASED_OUTPATIENT_CLINIC_OR_DEPARTMENT_OTHER): Payer: Self-pay

## 2024-12-06 MED ORDER — COMIRNATY 30 MCG/0.3ML IM SUSY
0.3000 mL | PREFILLED_SYRINGE | Freq: Once | INTRAMUSCULAR | 0 refills | Status: AC
  Filled 2024-12-06: qty 0.3, 1d supply, fill #0

## 2024-12-06 MED ORDER — FLUZONE 0.5 ML IM SUSY
0.5000 mL | PREFILLED_SYRINGE | Freq: Once | INTRAMUSCULAR | 0 refills | Status: AC
  Filled 2024-12-06: qty 0.5, 1d supply, fill #0

## 2024-12-12 ENCOUNTER — Ambulatory Visit: Admitting: Family Medicine
